# Patient Record
Sex: Male | Born: 1990 | Race: Black or African American | Hispanic: No | Marital: Single | Smoking: Heavy tobacco smoker
Health system: Southern US, Community
[De-identification: ages and names within clinical notes are randomized; demographics above are authoritative.]

## PROBLEM LIST (undated history)

## (undated) ENCOUNTER — Emergency Department (HOSPITAL_COMMUNITY): Admission: EM | Payer: Self-pay | Source: Home / Self Care

## (undated) DIAGNOSIS — A549 Gonococcal infection, unspecified: Secondary | ICD-10-CM

## (undated) DIAGNOSIS — E119 Type 2 diabetes mellitus without complications: Secondary | ICD-10-CM

## (undated) DIAGNOSIS — I499 Cardiac arrhythmia, unspecified: Secondary | ICD-10-CM

---

## 2013-06-23 ENCOUNTER — Observation Stay (HOSPITAL_COMMUNITY)
Admission: EM | Admit: 2013-06-23 | Discharge: 2013-06-24 | Disposition: A | Discharge status: Home / Self Care | Payer: Self-pay | Attending: Emergency Medicine | Admitting: Emergency Medicine

## 2013-06-23 ENCOUNTER — Emergency Department (HOSPITAL_COMMUNITY): Payer: Self-pay

## 2013-06-23 ENCOUNTER — Encounter (HOSPITAL_COMMUNITY): Payer: Self-pay | Admitting: Emergency Medicine

## 2013-06-23 DIAGNOSIS — J159 Unspecified bacterial pneumonia: Secondary | ICD-10-CM | POA: Insufficient documentation

## 2013-06-23 DIAGNOSIS — J189 Pneumonia, unspecified organism: Secondary | ICD-10-CM

## 2013-06-23 DIAGNOSIS — F17200 Nicotine dependence, unspecified, uncomplicated: Secondary | ICD-10-CM | POA: Insufficient documentation

## 2013-06-23 DIAGNOSIS — J020 Streptococcal pharyngitis: Secondary | ICD-10-CM

## 2013-06-23 DIAGNOSIS — I4891 Unspecified atrial fibrillation: Principal | ICD-10-CM

## 2013-06-23 DIAGNOSIS — R002 Palpitations: Secondary | ICD-10-CM | POA: Insufficient documentation

## 2013-06-23 DIAGNOSIS — R110 Nausea: Secondary | ICD-10-CM | POA: Insufficient documentation

## 2013-06-23 DIAGNOSIS — Z720 Tobacco use: Secondary | ICD-10-CM

## 2013-06-23 DIAGNOSIS — I059 Rheumatic mitral valve disease, unspecified: Secondary | ICD-10-CM

## 2013-06-23 HISTORY — DX: Cardiac arrhythmia, unspecified: I49.9

## 2013-06-23 LAB — CBC
HEMATOCRIT: 51.7 % (ref 39.0–52.0)
Hemoglobin: 18.3 g/dL — ABNORMAL HIGH (ref 13.0–17.0)
MCH: 32.9 pg (ref 26.0–34.0)
MCHC: 35.4 g/dL (ref 30.0–36.0)
MCV: 92.8 fL (ref 78.0–100.0)
Platelets: 197 10*3/uL (ref 150–400)
RBC: 5.57 MIL/uL (ref 4.22–5.81)
RDW: 12.7 % (ref 11.5–15.5)
WBC: 7.1 10*3/uL (ref 4.0–10.5)

## 2013-06-23 LAB — BASIC METABOLIC PANEL
BUN: 13 mg/dL (ref 6–23)
CO2: 26 mEq/L (ref 19–32)
Calcium: 9.7 mg/dL (ref 8.4–10.5)
Chloride: 102 mEq/L (ref 96–112)
Creatinine, Ser: 1.11 mg/dL (ref 0.50–1.35)
GFR calc Af Amer: >90 mL/min (ref >90)
Glucose, Bld: 108 mg/dL — ABNORMAL HIGH (ref 70–99)
POTASSIUM: 4.5 meq/L (ref 3.7–5.3)
SODIUM: 141 meq/L (ref 137–147)

## 2013-06-23 LAB — RAPID URINE DRUG SCREEN, HOSP PERFORMED
AMPHETAMINES: NOT DETECTED
Barbiturates: NOT DETECTED
Benzodiazepines: NOT DETECTED
Cocaine: NOT DETECTED
OPIATES: NOT DETECTED
TETRAHYDROCANNABINOL: NOT DETECTED

## 2013-06-23 LAB — TROPONIN I: Troponin I: <0.3 ng/mL (ref <0.30)

## 2013-06-23 LAB — I-STAT TROPONIN, ED: Troponin i, poc: 0.01 ng/mL (ref 0.00–0.08)

## 2013-06-23 MED ORDER — ENOXAPARIN SODIUM 40 MG/0.4ML ~~LOC~~ SOLN
40.0000 mg | SUBCUTANEOUS | Status: DC
  Administered 2013-06-23: 40 mg via SUBCUTANEOUS
  Filled 2013-06-23 (×2): qty 0.4

## 2013-06-23 MED ORDER — ZOLPIDEM TARTRATE 5 MG PO TABS: 5.0000 mg | ORAL_TABLET | Freq: Every evening | ORAL | Status: DC | PRN

## 2013-06-23 MED ORDER — SODIUM CHLORIDE 0.9 % IV BOLUS (SEPSIS)
1000.0000 mL | Freq: Once | INTRAVENOUS | Status: AC
  Administered 2013-06-23: 1000 mL via INTRAVENOUS

## 2013-06-23 MED ORDER — DEXTROSE 5 % IV SOLN
1.0000 g | Freq: Once | INTRAVENOUS | Status: AC
  Administered 2013-06-23: 1 g via INTRAVENOUS
  Filled 2013-06-23: qty 10

## 2013-06-23 MED ORDER — ALPRAZOLAM 0.25 MG PO TABS: 0.2500 mg | ORAL_TABLET | Freq: Two times a day (BID) | ORAL | Status: DC | PRN

## 2013-06-23 MED ORDER — ACETAMINOPHEN 325 MG PO TABS: 650.0000 mg | ORAL_TABLET | ORAL | Status: DC | PRN

## 2013-06-23 MED ORDER — ONDANSETRON HCL 4 MG/2ML IJ SOLN: 4.0000 mg | Freq: Four times a day (QID) | INTRAMUSCULAR | Status: DC | PRN

## 2013-06-23 MED ORDER — AZITHROMYCIN 1 G PO PACK
1.0000 g | PACK | Freq: Once | ORAL | Status: AC
  Administered 2013-06-23: 1 g via ORAL
  Filled 2013-06-23: qty 1

## 2013-06-23 MED ORDER — FLECAINIDE ACETATE 100 MG PO TABS
300.0000 mg | ORAL_TABLET | Freq: Once | ORAL | Status: AC
  Administered 2013-06-23: 300 mg via ORAL
  Filled 2013-06-23: qty 3

## 2013-06-23 MED ORDER — OFF THE BEAT BOOK
Freq: Once | Status: AC
  Administered 2013-06-23: 20:00:00
  Filled 2013-06-23: qty 1

## 2013-06-23 NOTE — ED Notes (Signed)
Pt returned from echo; back on monitor; cardiologist at bedside. 2L hung.

## 2013-06-23 NOTE — ED Provider Notes (Signed)
CSN: [id number]     Arrival date & time 06/23/13  1010 History   First MD Initiated Contact with Patient 06/23/13 1107     Chief Complaint  Patient presents with  . Chest Pain  . Palpitations      Patient is a 23 y.o. male presenting with palpitations. The history is provided by the patient.  Palpitations Palpitations quality:  Irregular Onset quality:  Sudden Duration:  12 hours Timing:  Intermittent Progression:  Improving Chronicity:  New Relieved by:  None tried Worsened by:  Nothing tried Associated symptoms: cough and nausea   Associated symptoms: no chest pain, no lower extremity edema, no shortness of breath, no syncope and no vomiting   pt reports onset of palpitations last night.  He has never had this before He denies any CP (nurse note reports he had CP) He reports he is feeling improved   No h/o CAD No h/o atrial fibrillation   PMH - none Soc hx - smokes cigarettes, social ETOH use.  No drug/cocaine/THC use.  Reports daily caffeine intake   History  Substance Use Topics  . Smoking status: Current Every Day Smoker  . Smokeless tobacco: Not on file  . Alcohol Use: Yes    Review of Systems  Constitutional: Negative for fever.  Respiratory: Positive for cough. Negative for shortness of breath.   Cardiovascular: Positive for palpitations. Negative for chest pain and syncope.  Gastrointestinal: Positive for nausea. Negative for vomiting.  Neurological: Negative for syncope.  All other systems reviewed and are negative.     Allergies  Review of patient's allergies indicates no known allergies.  Home Medications   Prior to Admission medications   Not on File   BP 102/63  Pulse 84  Temp(Src) 98.2 F (36.8 C) (Oral)  Resp 18  Ht 5\' 9"  (1.753 m)  Wt 186 lb (84.369 kg)  BMI 27.45 kg/m2  SpO2 97% Physical Exam CONSTITUTIONAL: Well developed/well nourished HEAD: Normocephalic/atraumatic EYES: EOMI/PERRL ENMT: Mucous membranes moist NECK:  supple no meningeal signs SPINE:entire spine nontender CV: irregular, rate controlled no murmurs/rubs/gallops noted LUNGS: Lungs are clear to auscultation bilaterally, no apparent distress ABDOMEN: soft, nontender, no rebound or guarding GU:no cva tenderness NEURO: Pt is awake/alert, moves all extremitiesx4 EXTREMITIES: pulses normal, full ROM SKIN: warm, color normal PSYCH: no abnormalities of mood noted  ED Course  Procedures  11:41 AM Pt currently denies CP He does have new onset atrial fibrillation He is well appearing ?pneumonia by CXR, will treat with antibiotics 11:53 AM D/w dr bensimhon He has reviewed EKG He requests urine drug screen He also requests stat bedside echo 1:34 PM D/w cardiology will admit Echo is "normal" per cardiology  Labs Review Labs Reviewed  CBC - Abnormal; Notable for the following:    Hemoglobin 18.3 (*)    All other components within normal limits  BASIC METABOLIC PANEL - Abnormal; Notable for the following:    Glucose, Bld 108 (*)    All other components within normal limits  I-STAT TROPOININ, ED    Imaging Review Dg Chest 2 View (if Patient Has Fever And/or Copd)  06/23/2013   CLINICAL DATA:  Cough and congestion  EXAM: CHEST  2 VIEW  COMPARISON:  None.  FINDINGS: The heart size and mediastinal contours are within normal limits. Minimal patchy left upper lobe airspace opacity is identified. The visualized skeletal structures are unremarkable.  IMPRESSION: Patchy left upper lobe airspace opacity. Rounded pneumonia could have this appearance and may be seen in  a relatively young age group. Followup to radiographic resolution is recommended in 4-6 weeks to document resolution.   Electronically Signed   By: Christiana PellantGretchen  Green M.D.   On: 06/23/2013 11:00     EKG Interpretation   Date/Time:  Friday June 23 2013 11:27:24 EDT Ventricular Rate:  85 PR Interval:    QRS Duration: 76 QT Interval:  362 QTC Calculation: 430 R Axis:   72 Text  Interpretation:  Atrial fibrillation Ventricular premature complex  Abnormal T, consider ischemia, diffuse leads ST elevation, consider  anterior injury Confirmed by Bebe ShaggyWICKLINE  MD, Maceo Hernan ([id number]) on 06/23/2013  11:38:26 AM      MDM   Final diagnoses:  Atrial fibrillation  CAP (community acquired pneumonia)    Nursing notes including past medical history and social history reviewed and considered in documentation xrays reviewed and considered Labs/vital reviewed and considered     Joya Gaskinsonald W Worth Kober, MD 06/23/13 1334

## 2013-06-23 NOTE — ED Notes (Signed)
Pt c/o center chest pain and palpitations onset today while at work. Pt reports that he was feeling lightheaded also. Last night pt experienced nausea while lying down.

## 2013-06-23 NOTE — ED Notes (Signed)
Pt to echo.

## 2013-06-23 NOTE — ED Notes (Signed)
repeat EKG given to EDP

## 2013-06-23 NOTE — ED Notes (Signed)
Pt given urinal and asked to provide specimen

## 2013-06-23 NOTE — ED Notes (Signed)
Pt states he has recently been very dehydrated due to episode of strep throat. Throat is feeling better.

## 2013-06-23 NOTE — ED Notes (Signed)
1st attempt at report 

## 2013-06-23 NOTE — H&P (Signed)
Lelon Perla, MD Physician Addendum Cardiology Consult Note Service date: 06/23/2013 1:07 PM         HPI: 23 year old male with no prior history for evaluation of atrial fibrillation. Patient typically does not have significant dyspnea on exertion, orthopnea, PND, pedal edema, palpitations, syncope or chest pain. At 11:00 last night he developed palpitations. These were described as a skip. When he awoke this morning his symptoms had resolved. However he went to work and noted recurrence of his symptoms and there was associated chest heaviness, dyspnea and weakness. No syncope. He presented to the emergency room and was found to be in atrial fibrillation. Cardiology asked to evaluate. Patient does state that he had strep throat 48 hours ago. He took an antibiotic but does not recall the name. Symptoms have resolved.   (Not in a hospital admission)   No Known Allergies  History reviewed. No pertinent past medical history.   History reviewed. No pertinent past surgical history.    History       Social History   .  Marital Status:  Single       Spouse Name:  N/A       Number of Children:  2   .  Years of Education:  N/A       Occupational History   .  WASTE MANAGEMENT  [employer]       Social History Main Topics   .  Smoking status:  Current Every Day Smoker   .  Smokeless tobacco:  Not on file   .  Alcohol Use:  Yes   .  Drug Use:  No   .  Sexual Activity:  Not on file       Other Topics  Concern   .  Not on file       Social History Narrative   .  No narrative on file      No family history on file.   ROS:  Recent strep throat but no fevers or chills, productive cough, hemoptysis, dysphasia, odynophagia, melena, hematochezia, dysuria, hematuria, rash, seizure activity, orthopnea, PND, pedal edema, claudication. Remaining systems are negative.   Physical Exam:    Blood pressure 101/71, pulse 83, temperature 98.2 F (36.8 C), temperature source Oral,  resp. rate 17, height 5' 9"  (1.753 m), weight 186 lb (84.369 kg), SpO2 99.00%.   General:  Well developed/well nourished in NAD Skin warm/dry Patient not depressed No peripheral clubbing Back-normal HEENT-normal/normal eyelids Neck supple/normal carotid upstroke bilaterally; no bruits; no JVD; no thyromegaly chest - CTA/ normal expansion CV - irregular/normal S1 and S2; no murmurs, rubs or gallops;  PMI nondisplaced Abdomen -NT/ND, no HSM, no mass, + bowel sounds, no bruit 2+ femoral pulses, no bruits Ext-no edema, chords, 2+ DP Neuro-grossly nonfocal   ECG Atrial fibrillation with Early repolarization abnormality and inferior lateral T-wave inversion    Results for orders placed during the hospital encounter of 06/23/13 (from the past 48 hour(s))   CBC     Status: Abnormal     Collection Time      06/23/13 10:45 AM       Result  Value  Ref Range     WBC  7.1   4.0 - 10.5 K/uL     RBC  5.57   4.22 - 5.81 MIL/uL     Hemoglobin  18.3 (*)  13.0 - 17.0 g/dL     HCT  51.7   39.0 - 52.0 %  MCV  92.8   78.0 - 100.0 fL     MCH  32.9   26.0 - 34.0 pg     MCHC  35.4   30.0 - 36.0 g/dL     RDW  12.7   11.5 - 15.5 %     Platelets  197   150 - 400 K/uL   BASIC METABOLIC PANEL     Status: Abnormal     Collection Time      06/23/13 10:45 AM       Result  Value  Ref Range     Sodium  141   137 - 147 mEq/L     Potassium  4.5   3.7 - 5.3 mEq/L     Chloride  102   96 - 112 mEq/L     CO2  26   19 - 32 mEq/L     Glucose, Bld  108 (*)  70 - 99 mg/dL     BUN  13   6 - 23 mg/dL     Creatinine, Ser  1.11   0.50 - 1.35 mg/dL     Calcium  9.7   8.4 - 10.5 mg/dL     GFR calc non Af Amer  >90   >90 mL/min     GFR calc Af Amer  >90   >90 mL/min     Comment:  (NOTE)        The eGFR has been calculated using the CKD EPI equation.        This calculation has not been validated in all clinical situations.        eGFR's persistently <90 mL/min signify possible Chronic Kidney        Disease.    Randolm Idol, ED     Status: None     Collection Time      06/23/13 11:26 AM       Result  Value  Ref Range     Troponin i, poc  0.01   0.00 - 0.08 ng/mL     Comment 3                Comment:  Due to the release kinetics of cTnI,        a negative result within the first hours        of the onset of symptoms does not rule out        myocardial infarction with certainty.        If myocardial infarction is still suspected,        repeat the test at appropriate intervals.      Dg Chest 2 View (if Patient Has Fever And/or Copd)   06/23/2013   CLINICAL DATA:  Cough and congestion  EXAM: CHEST  2 VIEW  COMPARISON:  None.  FINDINGS: The heart size and mediastinal contours are within normal limits. Minimal patchy left upper lobe airspace opacity is identified. The visualized skeletal structures are unremarkable.  IMPRESSION: Patchy left upper lobe airspace opacity. Rounded pneumonia could have this appearance and may be seen in a relatively young age group. Followup to radiographic resolution is recommended in 4-6 weeks to document resolution.   Electronically Signed   By: Conchita Paris M.D.   On: 06/23/2013 11:00     Assessment/Plan 1 atrial fibrillation-symptoms began at 11:00 last night. No embolic risk factors. Add enteric-coated aspirin 81 mg daily. Rate is controlled on no medications. Check echocardiogram and TSH. I will give flecainide 300  mg by mouth x1 if LV function normal to see if he converts. 2 chest heaviness-electrocardiogram most likely consistent with early repolarization abnormality. Cycle enzymes and if negative no further ischemia evaluation. 3 tobacco abuse-patient counseled on discontinuing. 4 abnormal chest x-ray-left upper lobe airspace disease on chest x-ray. Repeat PA and lateral chest x-ray tomorrow morning.   Kirk Ruths MD 06/23/2013, 1:07 PM

## 2013-06-23 NOTE — Consult Note (Addendum)
HPI: 23 year old male with no prior history for evaluation of atrial fibrillation. Patient typically does not have significant dyspnea on exertion, orthopnea, PND, pedal edema, palpitations, syncope or chest pain. At 11:00 last night he developed palpitations. These were described as a skip. When he awoke this morning his symptoms had resolved. However he went to work and noted recurrence of his symptoms and there was associated chest heaviness, dyspnea and weakness. No syncope. He presented to the emergency room and was found to be in atrial fibrillation. Cardiology asked to evaluate. Patient does state that he had strep throat 48 hours ago. He took an antibiotic but does not recall the name. Symptoms have resolved.   (Not in a hospital admission)  No Known Allergies  History reviewed. No pertinent past medical history.  History reviewed. No pertinent past surgical history.  History   Social History  . Marital Status: Single    Spouse Name: N/A    Number of Children: 2  . Years of Education: N/A   Occupational History  . WASTE MANAGEMENT [employer]   Social History Main Topics  . Smoking status: Current Every Day Smoker  . Smokeless tobacco: Not on file  . Alcohol Use: Yes  . Drug Use: No  . Sexual Activity: Not on file   Other Topics Concern  . Not on file   Social History Narrative  . No narrative on file    No family history on file.  ROS:  Recent strep throat but no fevers or chills, productive cough, hemoptysis, dysphasia, odynophagia, melena, hematochezia, dysuria, hematuria, rash, seizure activity, orthopnea, PND, pedal edema, claudication. Remaining systems are negative.  Physical Exam:   Blood pressure 101/71, pulse 83, temperature 98.2 F (36.8 C), temperature source Oral, resp. rate 17, height 5' 9"  (1.753 m), weight 186 lb (84.369 kg), SpO2 99.00%.  General:  Well developed/well nourished in NAD Skin warm/dry Patient not depressed No peripheral  clubbing Back-normal HEENT-normal/normal eyelids Neck supple/normal carotid upstroke bilaterally; no bruits; no JVD; no thyromegaly chest - CTA/ normal expansion CV - irregular/normal S1 and S2; no murmurs, rubs or gallops;  PMI nondisplaced Abdomen -NT/ND, no HSM, no mass, + bowel sounds, no bruit 2+ femoral pulses, no bruits Ext-no edema, chords, 2+ DP Neuro-grossly nonfocal  ECG Atrial fibrillation with Early repolarization abnormality and inferior lateral T-wave inversion  Results for orders placed during the hospital encounter of 06/23/13 (from the past 48 hour(s))  CBC     Status: Abnormal   Collection Time    06/23/13 10:45 AM      Result Value Ref Range   WBC 7.1  4.0 - 10.5 K/uL   RBC 5.57  4.22 - 5.81 MIL/uL   Hemoglobin 18.3 (*) 13.0 - 17.0 g/dL   HCT 51.7  39.0 - 52.0 %   MCV 92.8  78.0 - 100.0 fL   MCH 32.9  26.0 - 34.0 pg   MCHC 35.4  30.0 - 36.0 g/dL   RDW 12.7  11.5 - 15.5 %   Platelets 197  150 - 400 K/uL  BASIC METABOLIC PANEL     Status: Abnormal   Collection Time    06/23/13 10:45 AM      Result Value Ref Range   Sodium 141  137 - 147 mEq/L   Potassium 4.5  3.7 - 5.3 mEq/L   Chloride 102  96 - 112 mEq/L   CO2 26  19 - 32 mEq/L   Glucose, Bld 108 (*) 70 -  99 mg/dL   BUN 13  6 - 23 mg/dL   Creatinine, Ser 1.11  0.50 - 1.35 mg/dL   Calcium 9.7  8.4 - 10.5 mg/dL   GFR calc non Af Amer >90  >90 mL/min   GFR calc Af Amer >90  >90 mL/min   Comment: (NOTE)     The eGFR has been calculated using the CKD EPI equation.     This calculation has not been validated in all clinical situations.     eGFR's persistently <90 mL/min signify possible Chronic Kidney     Disease.  Randolm Idol, ED     Status: None   Collection Time    06/23/13 11:26 AM      Result Value Ref Range   Troponin i, poc 0.01  0.00 - 0.08 ng/mL   Comment 3            Comment: Due to the release kinetics of cTnI,     a negative result within the first hours     of the onset of symptoms  does not rule out     myocardial infarction with certainty.     If myocardial infarction is still suspected,     repeat the test at appropriate intervals.    Dg Chest 2 View (if Patient Has Fever And/or Copd)  06/23/2013   CLINICAL DATA:  Cough and congestion  EXAM: CHEST  2 VIEW  COMPARISON:  None.  FINDINGS: The heart size and mediastinal contours are within normal limits. Minimal patchy left upper lobe airspace opacity is identified. The visualized skeletal structures are unremarkable.  IMPRESSION: Patchy left upper lobe airspace opacity. Rounded pneumonia could have this appearance and may be seen in a relatively young age group. Followup to radiographic resolution is recommended in 4-6 weeks to document resolution.   Electronically Signed   By: Conchita Paris M.D.   On: 06/23/2013 11:00    Assessment/Plan 1 atrial fibrillation-symptoms began at 11:00 last night. No embolic risk factors. Add enteric-coated aspirin 81 mg daily. Rate is controlled on no medications. Check echocardiogram and TSH. I will give flecainide 300 mg by mouth x1 if LV function normal to see if he converts. 2 chest heaviness-electrocardiogram most likely consistent with early repolarization abnormality. Cycle enzymes and if negative no further ischemia evaluation. 3 tobacco abuse-patient counseled on discontinuing. 4 abnormal chest x-ray-left upper lobe airspace disease on chest x-ray. Repeat PA and lateral chest x-ray tomorrow morning.  Kirk Ruths MD 06/23/2013, 1:07 PM

## 2013-06-23 NOTE — Progress Notes (Signed)
Echo Lab  2D Echocardiogram completed.  Jeromie Gainor L Shylynn Bruning, RDCS 06/23/2013 12:45 PM

## 2013-06-23 NOTE — ED Notes (Signed)
MD at bedside. 

## 2013-06-23 NOTE — Progress Notes (Signed)
Patient in Normal Sinus rythm since admission to 3w31 on telemetry.  EKG obtained and confirmed.  Will continue to monitor.  Colman CaterFarrah Danielle Doniqua Saxby

## 2013-06-23 NOTE — ED Notes (Signed)
Pharm called for tambocor.

## 2013-06-24 DIAGNOSIS — J020 Streptococcal pharyngitis: Secondary | ICD-10-CM

## 2013-06-24 DIAGNOSIS — F17200 Nicotine dependence, unspecified, uncomplicated: Secondary | ICD-10-CM

## 2013-06-24 LAB — TROPONIN I: Troponin I: <0.3 ng/mL (ref <0.30)

## 2013-06-24 MED ORDER — DILTIAZEM HCL 30 MG PO TABS: 30.0000 mg | ORAL_TABLET | ORAL | Status: DC | PRN

## 2013-06-24 NOTE — Discharge Summary (Signed)
  Physician Discharge Summary  Patient ID: Ricardo Boyer MRN: [id number] DOB/AGE: 25/07/1990 25 y.o.  Admit date: 06/23/2013 Discharge date: 06/24/2013  Admission Diagnoses: Atrial Fibrillation   Discharge Diagnoses:  Active Problems:   Atrial fibrillation   Angina pectoris   Discharged Condition: stable  Hospital Course: Mr. Ricardo Boyer is a 23 y/o male with no prior cardiac history, but with a recent diagnosis of strep throat, placed on antibiotics by his PCP, who [facility] with a complaint of palpitations with associated chest heaviness, dyspnea and weakness. An EKG demonstrated atrial fibrillation with a CVC. W/u including a TSH, which was normal. Cardiac enzymes were negative x 3. A 2D echo revealed normal systolic function with an EF of 60-65%, mild MR and no pericardial effusion. A CXR demonstrated possible left upper lobe airspace disease. A repeat CXR in several weeks was recommended by radiology. Since he had normal systolic function by echo, it was decided to give a dose of Flecainide. He successfully converted back to NSR after one dose. He had no further recurrence. His CHADS-Vasc score was 0. Thus, no anticoagulation was indicated. He was last seen and examined by Dr. Purvis SheffieldKoneswaran, who determined he was stable for discharge home. He recommend that he f/u with his PCP and have repeat chest Xray in 4-6 weeks as recommended by radiology. He was prescribed Cardizem, 30 mg, to be used PRN for tachycardia-palpitations. [facility] either with Dr. Jens Somrenshaw or an APP in 2-3 weeks.    Consults: None  Significant Diagnostic Studies:    2D Echo 06/23/13 Study Conclusions  - Left ventricle: The cavity size was normal. Wall thickness was increased in a pattern of mild LVH. Systolic function was normal. The estimated ejection fraction was in the range of 60% to 65%. - Mitral valve: Mild regurgitation. - Right atrium: The atrium was mildly  dilated.   CXR 06/23/13 FINDINGS: The heart size and mediastinal contours are within normal limits. Minimal patchy left upper lobe airspace opacity is identified. The visualized skeletal structures are unremarkable.  IMPRESSION: Patchy left upper lobe airspace opacity. Rounded pneumonia could have this appearance and may be seen in a relatively young age group. Followup to radiographic resolution is recommended in 4-6 weeks to document resolution.   Treatments:See Hospital Course  Discharge Exam: Blood pressure 127/78, pulse 80, temperature 98.2 F (36.8 C), temperature source Oral, resp. rate 18, height 5\' 9"  (1.753 m), weight 186 lb (84.369 kg), SpO2 100.00%.   Disposition: 01-Home or Self Care      Discharge Orders   Future Orders Complete By Expires   Diet - low sodium heart healthy  As directed    Increase activity slowly  As directed        Medication List         diltiazem 30 MG tablet  Commonly known as:  CARDIZEM  Take 1 tablet (30 mg total) by mouth as needed (for palpitations/ fast heart rates).       Follow-up Information   Follow up with Olga MillersBrian Crenshaw, MD. (our office will call you on Monday to arrange a follow-up appointment)    Specialty:  Cardiology   Contact information:   [address] [place] [address] [phone]     TIME SPENT ON DISCHARGE, INCLUDING PHYSICIAN TIME: >30 MINUTES  Signed: Robbie LisBrittainy Sarie Stall 06/24/2013, 9:40 AM

## 2013-06-24 NOTE — Discharge Instructions (Signed)
Atrial Fibrillation Atrial fibrillation is a type of irregular heart rhythm (arrhythmia). During atrial fibrillation, the upper chambers of the heart (atria) quiver continuously in a chaotic pattern. This causes an irregular and often rapid heart rate.  Atrial fibrillation is the result of the heart becoming overloaded with disorganized signals that tell it to beat. These signals are normally released one at a time by a part of the right atrium called the sinoatrial node. They then travel from the atria to the lower chambers of the heart (ventricles), causing the atria and ventricles to contract and pump blood as they pass. In atrial fibrillation, parts of the atria outside of the sinoatrial node also release these signals. This results in two problems. First, the atria receive so many signals that they do not have time to fully contract. Second, the ventricles, which can only receive one signal at a time, beat irregularly and out of rhythm with the atria.  There are three types of atrial fibrillation:   Paroxysmal Paroxysmal atrial fibrillation starts suddenly and stops on its own within a week.   Persistent Persistent atrial fibrillation lasts for more than a week. It may stop on its own or with treatment.   Permanent Permanent atrial fibrillation does not go away. Episodes of atrial fibrillation may lead to permanent atrial fibrillation.  Atrial fibrillation can prevent your heart from pumping blood normally. It increases your risk of stroke and can lead to heart failure.  CAUSES   Heart conditions, including a heart attack, heart failure, coronary artery disease, and heart valve conditions.   Inflammation of the sac that surrounds the heart (pericarditis).   Blockage of an artery in the lungs (pulmonary embolism).   Pneumonia or other infections.   Chronic lung disease.   Thyroid problems, especially if the thyroid is overactive (hyperthyroidism).   Caffeine, excessive alcohol  use, and use of some illegal drugs.   Use of some medications, including certain decongestants and diet pills.   Heart surgery.   Birth defects.  Sometimes, no cause can be found. When this happens, the atrial fibrillation is called lone atrial fibrillation. The risk of complications from atrial fibrillation increases if you have lone atrial fibrillation and you are age 25 years or older. RISK FACTORS  Heart failure.  Coronary artery disease  Diabetes mellitus.   High blood pressure (hypertension).   Obesity.   Other arrhythmias.   Increased age. SYMPTOMS   A feeling that your heart is beating rapidly or irregularly.   A feeling of discomfort or pain in your chest.   Shortness of breath.   Sudden lightheadedness or weakness.   Getting tired easily when exercising.   Urinating more often than normal (mainly when atrial fibrillation first begins).  In paroxysmal atrial fibrillation, symptoms may start and suddenly stop. DIAGNOSIS  Your caregiver may be able to detect atrial fibrillation when taking your pulse. Usually, testing is needed to diagnosis atrial fibrillation. Tests may include:   Electrocardiography. During this test, the electrical impulses of your heart are recorded while you are lying down.   Echocardiography. During echocardiography, sound waves are used to evaluate how blood flows through your heart.   Stress test. There is more than one type of stress test. If a stress test is needed, ask your caregiver about which type is best for you.   Chest X-ray exam.   Blood tests.   Computed tomography (CT).  TREATMENT   Treating any underlying conditions. For example, if you have an overactive  thyroid, treating the condition may correct atrial fibrillation.   Medication. Medications may be given to control a rapid heart rate or to prevent blood clots, heart failure, or a stroke.   Procedure to correct the rhythm of the  heart:  Electrical cardioversion. During electrical cardioversion, a controlled, low-energy shock is delivered to the heart through your skin. If you have chest pain, very low pressure blood pressure, or sudden heart failure, this procedure may need to be done as an emergency.  Catheter ablation. During this procedure, heart tissues that send the signals that cause atrial fibrillation are destroyed.  Maze or minimaze procedure. During this surgery, thin lines of heart tissue that carry the abnormal signals are destroyed. The maze procedure is an open-heart surgery. The minimaze procedure is a minimally invasive surgery. This means that small cuts are made to access the heart instead of a large opening.  Pulmonary venous isolation. During this surgery, tissue around the veins that carry blood from the lungs (pulmonary veins) is destroyed. This tissue is thought to carry the abnormal signals. HOME CARE INSTRUCTIONS   Take medications as directed by your caregiver.  Only take medications that your caregiver approves. Some medications can make atrial fibrillation worse or recur.  If blood thinners were prescribed by your caregiver, take them exactly as directed. Too much can cause bleeding. Too little and you will not have the needed protection against stroke and other problems.  Perform blood tests at home if directed by your caregiver.  Perform blood tests exactly as directed.   Quit smoking if you smoke.   Do not drink alcohol.   Do not drink caffeinated beverages such as coffee, soda, and some teas. You may drink decaffeinated coffee, soda, or tea.   Maintain a healthy weight. Do not use diet pills unless your caregiver approves. They may make heart problems worse.   Follow diet instructions as directed by your caregiver.   Exercise regularly as directed by your caregiver.   Keep all follow-up appointments. PREVENTION  The following substances can cause atrial fibrillation  to recur:   Caffeinated beverages.   Alcohol.   Certain medications, especially those used for breathing problems.   Certain herbs and herbal medications, such as those containing ephedra or ginseng.  Illegal drugs such as cocaine and amphetamines. Sometimes medications are given to prevent atrial fibrillation from recurring. Proper treatment of any underlying condition is also important in helping prevent recurrence.  SEEK MEDICAL CARE IF:  You notice a change in the rate, rhythm, or strength of your heartbeat.   You suddenly begin urinating more frequently.   You tire more easily when exerting yourself or exercising.  SEEK IMMEDIATE MEDICAL CARE IF:   You develop chest pain, abdominal pain, sweating, or weakness.  You feel sick to your stomach (nauseous).  You develop shortness of breath.  You suddenly develop swollen feet and ankles.  You feel dizzy.  You face or limbs feel numb or weak.  There is a change in your vision or speech. MAKE SURE YOU:   Understand these instructions.  Will watch your condition.  Will get help right away if you are not doing well or get worse. Document Released: 02/23/2005 Document Revised: 06/20/2012 Document Reviewed: 04/05/2012 [facility] Patient Information 2014 [place], [place].   Cardiac Diet This diet can help prevent heart disease and stroke. Many factors influence your heart health, including eating and exercise habits. Coronary risk rises a lot with abnormal blood fat (lipid) levels. Cardiac meal planning  includes limiting unhealthy fats, increasing healthy fats, and making other small dietary changes. General guidelines are as follows:  Adjust calorie intake to reach and maintain desirable body weight.  Limit total fat intake to less than 30% of total calories. Saturated fat should be less than 7% of calories.  Saturated fats are found in animal products and in some vegetable products. Saturated vegetable fats are found  in coconut oil, cocoa butter, palm oil, and palm kernel oil. Read labels carefully to avoid these products as much as possible. Use butter in moderation. Choose tub margarines and oils that have 2 grams of fat or less. Good cooking oils are canola and olive oils.  Practice low-fat cooking techniques. Do not fry food. Instead, broil, bake, boil, steam, grill, roast on a rack, stir-fry, or microwave it. Other fat reducing suggestions include:  Remove the skin from poultry.  Remove all visible fat from meats.  Skim the fat off stews, soups, and gravies before serving them.  Steam vegetables in water or broth instead of sauting them in fat.  Avoid foods with trans fat (or hydrogenated oils), such as commercially fried foods and commercially baked goods. Commercial shortening and deep-frying fats will contain trans fat.  Increase intake of fruits, vegetables, whole grains, and legumes to replace foods high in fat.  Increase consumption of nuts, legumes, and seeds to at least 4 servings weekly. One serving of a legume equals  cup, and 1 serving of nuts or seeds equals  cup.  Choose whole grains more often. Have 3 servings per day (a serving is 1 ounce [oz]).  Eat 4 to 5 servings of vegetables per day. A serving of vegetables is 1 cup of raw leafy vegetables;  cup of raw or cooked cut-up vegetables;  cup of vegetable juice.  Eat 4 to 5 servings of fruit per day. A serving of fruit is 1 medium whole fruit;  cup of dried fruit;  cup of fresh, frozen, or canned fruit;  cup of 100% fruit juice.  Increase your intake of dietary fiber to 20 to 30 grams per day. Insoluble fiber may help lower your risk of heart disease and may help curb your appetite.  Soluble fiber binds cholesterol to be removed from the blood. Foods high in soluble fiber are dried beans, citrus fruits, oats, apples, bananas, broccoli, Brussels sprouts, and eggplant.  Try to include foods fortified with plant sterols or  stanols, such as yogurt, breads, juices, or margarines. Choose several fortified foods to achieve a daily intake of 2 to 3 grams of plant sterols or stanols.  Foods with omega-3 fats can help reduce your risk of heart disease. Aim to have a 3.5 oz portion of fatty fish twice per week, such as salmon, mackerel, albacore tuna, sardines, lake trout, or herring. If you wish to take a fish oil supplement, choose one that contains 1 gram of both DHA and EPA.  Limit processed meats to 2 servings (3 oz portion) weekly.  Limit the sodium in your diet to 1500 milligrams (mg) per day. If you have high blood pressure, talk to a registered dietitian about a DASH (Dietary Approaches to Stop Hypertension) eating plan.  Limit sweets and beverages with added sugar, such as soda, to no more than 5 servings per week. One serving is:   1 tablespoon sugar.  1 tablespoon jelly or jam.   cup sorbet.  1 cup lemonade.   cup regular soda. CHOOSING FOODS Starches  Allowed: Breads: All kinds (wheat,  rye, raisin, white, oatmeal, [place], [place], and English muffin bread). Low-fat rolls: English muffins, frankfurter and hamburger buns, bagels, pita bread, tortillas (not fried). Pancakes, waffles, biscuits, and muffins made with recommended oil.  Avoid: Products made with saturated or trans fats, oils, or whole milk products. Butter rolls, cheese breads, croissants. Commercial doughnuts, muffins, sweet rolls, biscuits, waffles, pancakes, store-bought mixes. Crackers  Allowed: Low-fat crackers and snacks: Animal, graham, rye, saltine (with recommended oil, no lard), oyster, and matzo crackers. Bread sticks, melba toast, rusks, flatbread, pretzels, and light popcorn.  Avoid: High-fat crackers: cheese crackers, butter crackers, and those made with coconut, palm oil, or trans fat (hydrogenated oils). Buttered popcorn. Cereals  Allowed: Hot or cold whole-grain cereals.  Avoid: Cereals containing coconut,  hydrogenated vegetable fat, or animal fat. Potatoes / Pasta / Rice  Allowed: All kinds of potatoes, rice, and pasta (such as macaroni, spaghetti, and noodles).  Avoid: Pasta or rice prepared with cream sauce or high-fat cheese. Chow mein noodles, [place] fries. Vegetables  Allowed: All vegetables and vegetable juices.  Avoid: Fried vegetables. Vegetables in cream, butter, or high-fat cheese sauces. Limit coconut. Fruit in cream or custard. Protein  Allowed: Limit your intake of meat, seafood, and poultry to no more than 6 oz (cooked weight) per day. All lean, well-trimmed beef, veal, pork, and lamb. All chicken and [place] without skin. All fish and shellfish. Wild game: wild duck, rabbit, pheasant, and venison. Egg whites or low-cholesterol egg substitutes may be used as desired. Meatless dishes: recipes with dried beans, peas, lentils, and tofu (soybean curd). Seeds and nuts: all seeds and most nuts.  Avoid: Prime grade and other heavily marbled and fatty meats, such as short ribs, spare ribs, rib eye roast or steak, frankfurters, sausage, bacon, and high-fat luncheon meats, mutton. Caviar. Commercially fried fish. Domestic duck, goose, venison sausage. Organ meats: liver, gizzard, heart, chitterlings, brains, kidney, sweetbreads. Dairy  Allowed: Low-fat cheeses: nonfat or low-fat cottage cheese (1% or 2% fat), cheeses made with part skim milk, such as mozzarella, farmers, string, or ricotta. (Cheeses should be labeled no more than 2 to 6 grams fat per oz.). Skim (or 1%) milk: liquid, powdered, or evaporated. Buttermilk made with low-fat milk. Drinks made with skim or low-fat milk or cocoa. Chocolate milk or cocoa made with skim or low-fat (1%) milk. Nonfat or low-fat yogurt.  Avoid: Whole milk cheeses, including colby, cheddar, mu[place]ster, [address] Jack, [place]i, [place]rie, [place]embert, [address], Swiss, and blue. Creamed cottage cheese, cream cheese. Whole milk and whole milk products, including  buttermilk or yogurt made from whole milk, drinks made from whole milk. Condensed milk, evaporated whole milk, and 2% milk. Soups and Combination Foods  Allowed: Low-fat low-sodium soups: broth, dehydrated soups, homemade broth, soups with the fat removed, homemade cream soups made with skim or low-fat milk. Low-fat spaghetti, lasagna, chili, and Spanish rice if low-fat ingredients and low-fat cooking techniques are used.  Avoid: Cream soups made with whole milk, cream, or high-fat cheese. All other soups. Desserts and Sweets  Allowed: Sherbet, fruit ices, gelatins, meringues, and angel food cake. Homemade desserts with recommended fats, oils, and milk products. Jam, jelly, honey, marmalade, sugars, and syrups. Pure sugar candy, such as gum drops, hard candy, jelly beans, marshmallows, mints, and small amounts of dark chocolate.  Avoid: Commercially prepared cakes, pies, cookies, frosting, pudding, or mixes for these products. Desserts containing whole milk products, chocolate, coconut, lard, palm oil, or palm kernel oil. Ice cream or ice cream drinks. Candy that contains chocolate, coconut,  butter, hydrogenated fat, or unknown ingredients. Buttered syrups. Fats and Oils  Allowed: Vegetable oils: safflower, sunflower, corn, soybean, cottonseed, sesame, canola, olive, or peanut. Non-hydrogenated margarines. Salad dressing or mayonnaise: homemade or commercial, made with a recommended oil. Low or nonfat salad dressing or mayonnaise.  Limit added fats and oils to 6 to 8 tsp per day (includes fats used in cooking, baking, salads, and spreads on bread). Remember to count the "hidden fats" in foods.  Avoid: Solid fats and shortenings: butter, lard, salt pork, bacon drippings. Gravy containing meat fat, shortening, or suet. Cocoa butter, coconut. Coconut oil, palm oil, palm kernel oil, or hydrogenated oils: these ingredients are often used in bakery products, nondairy creamers, whipped toppings, candy, and  commercially fried foods. Read labels carefully. Salad dressings made of unknown oils, sour cream, or cheese, such as blue cheese and Roquefort. Cream, all kinds: half-and-half, light, heavy, or whipping. Sour cream or cream cheese (even if "light" or low-fat). Nondairy cream substitutes: coffee creamers and sour cream substitutes made with palm, palm kernel, hydrogenated oils, or coconut oil. Beverages  Allowed: Coffee (regular or decaffeinated), tea. Diet carbonated beverages, mineral water. Alcohol: Check with your caregiver. Moderation is recommended.  Avoid: Whole milk, regular sodas, and juice drinks with added sugar. Condiments  Allowed: All seasonings and condiments. Cocoa powder. "Cream" sauces made with recommended ingredients.  Avoid: Carob powder made with hydrogenated fats. SAMPLE MENU Breakfast   cup orange juice   cup oatmeal  1 slice toast  1 tsp margarine  1 cup skim milk Lunch  [place] sandwich with 2 oz [place], 2 slices bread  Lettuce and tomato slices  Fresh fruit  Carrot sticks  Coffee or tea Snack  Fresh fruit or low-fat crackers Dinner  3 oz lean ground beef  1 baked potato  1 tsp margarine   cup asparagus  Lettuce salad  1 tbs non-creamy dressing   cup peach slices  1 cup skim milk Document Released: 12/03/2007 Document Revised: 08/25/2011 Document Reviewed: 05/19/2011 [employer] Patient Information 2014 [place], [place].

## 2013-06-24 NOTE — Progress Notes (Signed)
SUBJECTIVE: Pt says he has been feeling well since 5 pm yesterday, and wants to go home. Denies fevers, chills, shortness of breath, palpitations, and chest pain.    No intake or output data in the 24 hours ending 06/24/13 0809  Current Facility-Administered Medications  Medication Dose Route Frequency Provider Last Rate Last Dose  . acetaminophen (TYLENOL) tablet 650 mg  650 mg Oral Q4H PRN Rhonda G Barrett, PA-C      . ALPRAZolam (XANAX) tablet 0.25 mg  0.25 mg Oral BID PRN Joline Salthonda G Barrett, PA-C      . enoxaparin (LOVENOX) injection 40 mg  40 mg Subcutaneous Q24H Rhonda G Barrett, PA-C   40 mg at 06/23/13 1813  . ondansetron (ZOFRAN) injection 4 mg  4 mg Intravenous Q6H PRN Rhonda G Barrett, PA-C      . zolpidem (AMBIEN) tablet 5 mg  5 mg Oral QHS PRN,MR X 1 Rhonda G Barrett, PA-C        Filed Vitals:   06/23/13 1430 06/23/13 1454 06/23/13 1943 06/24/13 0655  BP: 102/76 103/61 117/73 127/78  Pulse: 35 85 82 80  Temp:  98 F (36.7 C) 97.7 F (36.5 C) 98.2 F (36.8 C)  TempSrc:  Oral Oral Oral  Resp: 22 20 18 18   Height:      Weight:      SpO2: 97% 100% 99% 100%    PHYSICAL EXAM General: NAD Neck: No JVD, no thyromegaly.  Lungs: Clear to auscultation bilaterally with normal respiratory effort. CV: Nondisplaced PMI.  Regular rate and rhythm, normal S1/S2, no S3/S4, no murmur.  No pretibial edema.  No carotid bruit.  Normal pedal pulses.  Abdomen: Soft, nontender, no hepatosplenomegaly, no distention.  Neurologic: Alert and oriented x 3.  Psych: Normal affect. Extremities: No clubbing or cyanosis.   TELEMETRY: Reviewed telemetry pt in sinus rhythm.  LABS: Basic Metabolic Panel:  Recent Labs  40/98/1104/17/15 1045  NA 141  K 4.5  CL 102  CO2 26  GLUCOSE 108*  BUN 13  CREATININE 1.11  CALCIUM 9.7   Liver Function Tests: No results found for this basename: AST, ALT, ALKPHOS, BILITOT, PROT, ALBUMIN,  in the last 72 hours No results found for this basename:  LIPASE, AMYLASE,  in the last 72 hours CBC:  Recent Labs  06/23/13 1045  WBC 7.1  HGB 18.3*  HCT 51.7  MCV 92.8  PLT 197   Cardiac Enzymes:  Recent Labs  06/23/13 1627 06/23/13 2249 06/24/13 0335  TROPONINI <0.30 <0.30 <0.30   BNP: No components found with this basename: POCBNP,  D-Dimer: No results found for this basename: DDIMER,  in the last 72 hours Hemoglobin A1C: No results found for this basename: HGBA1C,  in the last 72 hours Fasting Lipid Panel: No results found for this basename: CHOL, HDL, LDLCALC, TRIG, CHOLHDL, LDLDIRECT,  in the last 72 hours Thyroid Function Tests: No results found for this basename: TSH, T4TOTAL, FREET3, T3FREE, THYROIDAB,  in the last 72 hours Anemia Panel: No results found for this basename: VITAMINB12, FOLATE, FERRITIN, TIBC, IRON, RETICCTPCT,  in the last 72 hours  RADIOLOGY: Dg Chest 2 View (if Patient Has Fever And/or Copd)  06/23/2013   CLINICAL DATA:  Cough and congestion  EXAM: CHEST  2 VIEW  COMPARISON:  None.  FINDINGS: The heart size and mediastinal contours are within normal limits. Minimal patchy left upper lobe airspace opacity is identified. The visualized skeletal structures are unremarkable.  IMPRESSION: Patchy  left upper lobe airspace opacity. Rounded pneumonia could have this appearance and may be seen in a relatively young age group. Followup to radiographic resolution is recommended in 4-6 weeks to document resolution.   Electronically Signed   By: Christiana PellantGretchen  Green M.D.   On: 06/23/2013 11:00      ASSESSMENT AND PLAN: 1. Paroxysmal atrial fibrillation: Patient converted to NSR yesterday with one dose of flecainide. No recurrences. CHADS-Vasc score of 0. Thus, no anticoagulation indicated. Likely precipitated by stress of strep throat with antibiotic use. Would recommend he f/u with PCP and have repeat chest xray in 4-6 weeks as recommended by radiology. Can prescribe diltiazem 30 mg prn for tachycardia-palpitations.  Should f/u in cardiology office within next few weeks.  Dispo: Discharge to home today.   Prentice DockerSuresh Cabell Lazenby, M.D., F.A.C.C.

## 2013-06-24 NOTE — Progress Notes (Signed)
UR Completed Casimir Barcellos Graves-Bigelow, RN,BSN [phone]  

## 2013-07-06 ENCOUNTER — Encounter: Payer: Self-pay | Admitting: *Deleted

## 2013-07-11 ENCOUNTER — Ambulatory Visit
Admission: RE | Admit: 2013-07-11 | Discharge: 2013-07-11 | Disposition: A | Discharge status: Home / Self Care | Payer: Self-pay | Source: Ambulatory Visit | Attending: Physician Assistant | Admitting: Physician Assistant

## 2013-07-11 ENCOUNTER — Encounter: Payer: Self-pay | Admitting: Physician Assistant

## 2013-07-11 ENCOUNTER — Ambulatory Visit (INDEPENDENT_AMBULATORY_CARE_PROVIDER_SITE_OTHER): Payer: Self-pay | Admitting: Physician Assistant

## 2013-07-11 VITALS — BP 110/70 | HR 62 | Ht 69.0 in | Wt 184.0 lb

## 2013-07-11 DIAGNOSIS — I4891 Unspecified atrial fibrillation: Secondary | ICD-10-CM

## 2013-07-11 DIAGNOSIS — Z720 Tobacco use: Secondary | ICD-10-CM

## 2013-07-11 DIAGNOSIS — R9389 Abnormal findings on diagnostic imaging of other specified body structures: Secondary | ICD-10-CM | POA: Insufficient documentation

## 2013-07-11 DIAGNOSIS — R918 Other nonspecific abnormal finding of lung field: Secondary | ICD-10-CM

## 2013-07-11 DIAGNOSIS — F17200 Nicotine dependence, unspecified, uncomplicated: Secondary | ICD-10-CM

## 2013-07-11 NOTE — Progress Notes (Signed)
  [address] [place], [place]  [address] Phone: [phone] Fax:  [phone]  Date:  07/11/2013   ID:  Ricardo MustacheOscar Javid, DOB 06/04/1990, MRN [id number]  PCP:  No PCP Per Patient  Cardiologist:  Dr. Olga MillersBrian Crenshaw     History of Present Illness: Ricardo Boyer is a 23 y.o. male smoker with no significant past medical history. He was recently admitted 4/17-4/18 with new onset atrial fibrillation. He had recently been treated with antibiotics for strep throat. EF was normal on Echo.  He converted to NSR on one dose of flecainide 300 mg. Chest x-ray was abnormal and follow up has been recommended.  He was given prn Diltiazem to use for palpitations in the future.  CHADS2-VASc=0.  He therefore did not require long-term anticoagulation.   Since d/c, he is doing well.  The patient denies chest pain, shortness of breath, syncope, orthopnea, PND or significant pedal edema.   Studies:   - Echo (06/23/13):  Mild LVH, EF 60-65%, mild MR, mild RAE.   CXR (06/23/13): IMPRESSION: Patchy left upper lobe airspace opacity. Rounded pneumonia could have this appearance and may be seen in a relatively young age group. Followup to radiographic resolution is recommended in 4-6 weeks to document resolution   Recent Labs: 06/23/2013: Creatinine 1.11; Hemoglobin 18.3*; Potassium 4.5   Wt Readings from Last 3 Encounters:  07/11/13 184 lb (83.462 kg)  06/23/13 186 lb (84.369 kg)     Past Medical History  Diagnosis Date  . Dysrhythmia     atrial fibrilation    Current Outpatient Prescriptions  Medication Sig Dispense Refill  . diltiazem (CARDIZEM) 30 MG tablet Take 1 tablet (30 mg total) by mouth as needed (for palpitations/ fast heart rates).  30 tablet  3   No current facility-administered medications for this visit.    Allergies:   Review of patient's allergies indicates no known allergies.   Social History:  The patient  reports that he has been smoking Cigarettes.  He has a .5  pack-year smoking history. He has never used smokeless tobacco. He reports that he drinks alcohol. He reports that he does not use illicit drugs.   Family History:  The patient's family history is not on file.   ROS:  Please see the history of present illness.      All other systems reviewed and negative.   PHYSICAL EXAM: VS:  BP 110/70  Pulse 62  Ht 5\' 9"  (1.753 m)  Wt 184 lb (83.462 kg)  BMI 27.16 kg/m2 Well nourished, well developed, in no acute distress HEENT: normal Neck: no JVD Cardiac:  normal S1, S2; RRR; no murmur Lungs:  clear to auscultation bilaterally, no wheezing, rhonchi or rales Abd: soft, nontender, no hepatomegaly Ext: no edema Skin: warm and dry Neuro:  CNs 2-12 intact, no focal abnormalities noted  EKG:  NSR, HR 62, normal axis, LVH, early repol, no change from prior tracing     ASSESSMENT AND PLAN:  1. Atrial fibrillation:  Paroxysmal.  ? Related to recent strep pharyngitis.  No recurrence.  CHADS2-VASc=0.  He does not need anticoagulation Rx according to current guidelines.  He can use diltiazem prn for palps. 2. Abnormal CXR:  Arrange f/u CXR. 3. Tobacco abuse:  I have recommended he quit. 4. Disposition:  F/u with Dr. Olga MillersBrian Crenshaw in 3 mos.  Signed, Tereso NewcomerScott Diandre Merica, PA-C  07/11/2013 12:24 PM

## 2013-07-11 NOTE — Patient Instructions (Signed)
CHEST X-RAY TODAY AT [facility] ON [address]  Your physician wants you to follow-up in: 3 MONTHS WITH DR. CRENSHAW. You will receive a reminder letter in the mail two months in advance. If you don't receive a letter, please call our office to schedule the follow-up appointment.

## 2013-07-12 ENCOUNTER — Telehealth: Payer: Self-pay | Admitting: *Deleted

## 2013-07-12 DIAGNOSIS — R9389 Abnormal findings on diagnostic imaging of other specified body structures: Secondary | ICD-10-CM

## 2013-07-12 NOTE — Telephone Encounter (Signed)
Per Bing NeighborsScott W. PA need to refer pt to PCP

## 2013-07-12 NOTE — Telephone Encounter (Signed)
pt notified about cxr results and will have repeat cxr 6/12, will see Bing NeighborsScott W. PA on 6/15 @ 3:40, pt verbalized Plan of Care to all instructions.

## 2013-07-12 NOTE — Addendum Note (Signed)
Addended by: Tarri FullerFIATO, Raisa Ditto M on: 07/12/2013 11:45 AM   Modules accepted: Orders

## 2013-08-18 ENCOUNTER — Ambulatory Visit: Payer: Self-pay | Attending: Internal Medicine

## 2013-08-21 ENCOUNTER — Telehealth: Payer: Self-pay | Admitting: *Deleted

## 2013-08-21 ENCOUNTER — Ambulatory Visit: Payer: Self-pay | Admitting: Physician Assistant

## 2013-08-21 NOTE — Telephone Encounter (Signed)
appt made in error, pt was just supposed to f/u in 1 month for repeat CXR, and 3 months w/Dr. Jens Somrenshaw, pt not having any problems at this time per Aram Beechamynthia, pt's spouse, per SW ok to cancel appt. ..cmf

## 2013-10-24 ENCOUNTER — Ambulatory Visit: Payer: Self-pay | Admitting: Cardiovascular Disease

## 2015-03-13 ENCOUNTER — Encounter (HOSPITAL_COMMUNITY): Payer: Self-pay | Admitting: Emergency Medicine

## 2015-03-13 ENCOUNTER — Emergency Department (HOSPITAL_COMMUNITY)
Admission: EM | Admit: 2015-03-13 | Discharge: 2015-03-13 | Disposition: A | Discharge status: Home / Self Care | Payer: Self-pay | Attending: Emergency Medicine | Admitting: Emergency Medicine

## 2015-03-13 DIAGNOSIS — Z202 Contact with and (suspected) exposure to infections with a predominantly sexual mode of transmission: Secondary | ICD-10-CM | POA: Insufficient documentation

## 2015-03-13 DIAGNOSIS — R300 Dysuria: Secondary | ICD-10-CM | POA: Insufficient documentation

## 2015-03-13 DIAGNOSIS — Z8679 Personal history of other diseases of the circulatory system: Secondary | ICD-10-CM | POA: Insufficient documentation

## 2015-03-13 DIAGNOSIS — F17210 Nicotine dependence, cigarettes, uncomplicated: Secondary | ICD-10-CM | POA: Insufficient documentation

## 2015-03-13 LAB — URINALYSIS, ROUTINE W REFLEX MICROSCOPIC
BILIRUBIN URINE: NEGATIVE
Glucose, UA: NEGATIVE mg/dL
HGB URINE DIPSTICK: NEGATIVE
Ketones, ur: NEGATIVE mg/dL
Nitrite: NEGATIVE
Protein, ur: NEGATIVE mg/dL
SPECIFIC GRAVITY, URINE: 1.023 (ref 1.005–1.030)
pH: 6.5 (ref 5.0–8.0)

## 2015-03-13 LAB — URINE MICROSCOPIC-ADD ON: RBC / HPF: NONE SEEN RBC/hpf (ref 0–5)

## 2015-03-13 MED ORDER — CEFTRIAXONE SODIUM 250 MG IJ SOLR
250.0000 mg | Freq: Once | INTRAMUSCULAR | Status: AC
  Administered 2015-03-13: 250 mg via INTRAMUSCULAR
  Filled 2015-03-13: qty 250

## 2015-03-13 MED ORDER — ONDANSETRON 4 MG PO TBDP
4.0000 mg | ORAL_TABLET | Freq: Once | ORAL | Status: AC
  Administered 2015-03-13: 4 mg via ORAL
  Filled 2015-03-13: qty 1

## 2015-03-13 MED ORDER — AZITHROMYCIN 250 MG PO TABS
1000.0000 mg | ORAL_TABLET | Freq: Once | ORAL | Status: AC
  Administered 2015-03-13: 1000 mg via ORAL
  Filled 2015-03-13: qty 4

## 2015-03-13 MED ORDER — LIDOCAINE HCL (PF) 1 % IJ SOLN
0.9000 mL | Freq: Once | INTRAMUSCULAR | Status: AC
  Administered 2015-03-13: 0.9 mL
  Filled 2015-03-13: qty 5

## 2015-03-13 NOTE — ED Notes (Signed)
One touch patient see provider assessment.

## 2015-03-13 NOTE — ED Provider Notes (Signed)
CSN: [id number]     Arrival date & time 03/13/15  1714 History  By signing my name below, I, Placido Sou, attest that this documentation has been prepared under the direction and in the presence of Arthor Captain, PA-C. Electronically Signed: Placido Sou, ED Scribe. 03/13/2015. 5:55 PM.    Chief Complaint  Patient presents with  . Exposure to STD   The history is provided by the patient. No language interpreter was used.    HPI Comments: Sumedh Shinsato is a 25 y.o. male who presents to the Emergency Department complaining of mild, dysuria with onset 1 week ago. He describes the sensation as a "pinch". He is concerned of exposure to an STD with his male partner who he confirms currently having unprotected sex with. He notes a hx of smoking 2 PPD. Pt has no known drug allergies. Pt denies penile discharge, testicular pain, abd pain or any other associated symptoms at this time.   Past Medical History  Diagnosis Date  . Dysrhythmia     atrial fibrilation   History reviewed. No pertinent past surgical history. No family history on file. Social History  Substance Use Topics  . Smoking status: Heavy Tobacco Smoker -- 2.00 packs/day for 2 years    Types: Cigarettes  . Smokeless tobacco: Never Used  . Alcohol Use: No     Comment:      Review of Systems  Constitutional: Negative for fever and chills.  Gastrointestinal: Negative for abdominal pain.  Genitourinary: Positive for dysuria. Negative for testicular pain.    Allergies  Review of patient's allergies indicates no known allergies.  Home Medications   Prior to Admission medications   Medication Sig Start Date End Date Taking? Authorizing Provider  diltiazem (CARDIZEM) 30 MG tablet Take 1 tablet (30 mg total) by mouth as needed (for palpitations/ fast heart rates). 06/24/13   Brittainy M Simmons, PA-C   BP 148/81 mmHg  Pulse 95  Temp(Src) 98.2 F (36.8 C) (Oral)  Resp 16  Ht 5\' 10"  (1.778 m)  Wt 87.091 kg  BMI  27.55 kg/m2  SpO2 98%    Physical Exam  Constitutional: He is oriented to person, place, and time. He appears well-developed and well-nourished.  HENT:  Head: Normocephalic and atraumatic.  Neck: Normal range of motion.  Cardiovascular: Normal rate.   Pulmonary/Chest: Effort normal. No respiratory distress.  Abdominal: Soft.  Genitourinary:  Chaperone present nml male anatomy; circumcised penis; no testicular pain or swelling; no inguinal lymphadenopathy; no skin lesion; clear discharge at the urethral meatus   Musculoskeletal: Normal range of motion.  Neurological: He is alert and oriented to person, place, and time.  Skin: Skin is warm and dry. He is not diaphoretic.  Psychiatric: He has a normal mood and affect. His behavior is normal.  Nursing note and vitals reviewed.   ED Course  Procedures  DIAGNOSTIC STUDIES: Oxygen Saturation is 98% on RA, normal by my interpretation.    COORDINATION OF CARE: 5:51 PM Pt presents today due to possible STD exposure. Discussed next steps with pt and he is agreeable to the plan.   Labs Review Labs Reviewed  URINALYSIS, ROUTINE W REFLEX MICROSCOPIC (NOT AT [facility]) - Abnormal; Notable for the following:    Leukocytes, UA SMALL (*)    All other components within normal limits  URINE MICROSCOPIC-ADD ON - Abnormal; Notable for the following:    Squamous Epithelial / LPF 0-5 (*)    Bacteria, UA RARE (*)    All other components  within normal limits  GC/CHLAMYDIA PROBE AMP ([place]) NOT AT [facility] - Abnormal; Notable for the following:    Chlamydia **POSITIVE** (*)    Neisseria gonorrhea **POSITIVE** (*)    All other components within normal limits  RPR  HIV ANTIBODY (ROUTINE TESTING)    Imaging Review No results found. I have personally reviewed and evaluated these lab results as part of my medical decision-making.   EKG Interpretation None      MDM   Final diagnoses:  Exposure to STD    Patient treated in the ED for STI  with Zithromax and rocephin. Patient advised to inform and treat all sexual partners. Pt advised on safe sex practices and understands that they have GC/Chlamydia cultures pending and will result in 2-3 days. HIV and RPR sent. Pt encouraged to follow up at local health department for future STI checks. No concern for prostatitis or epididymitis. Discussed return precautions. Pt appears safe for discharge.   I personally performed the services described in this documentation, which was scribed in my presence. The recorded information has been reviewed and is accurate.      Arthor Captainbigail Myrla Malanowski, PA-C 03/18/15 1132  Loren Raceravid Yelverton, MD 03/19/15 [phone]

## 2015-03-13 NOTE — ED Notes (Signed)
Evaluation for possible STD.

## 2015-03-13 NOTE — Discharge Instructions (Signed)
Sexually Transmitted Disease °A sexually transmitted disease (STD) is a disease or infection that may be passed (transmitted) from person to person, usually during sexual activity. This may happen by way of saliva, semen, blood, vaginal mucus, or urine. Common STDs include: °· Gonorrhea. °· Chlamydia. °· Syphilis. °· HIV and AIDS. °· Genital herpes. °· Hepatitis B and C. °· Trichomonas. °· Human papillomavirus (HPV). °· Pubic lice. °· Scabies. °· Mites. °· Bacterial vaginosis. °WHAT ARE CAUSES OF STDs? °An STD may be caused by bacteria, a virus, or parasites. STDs are often transmitted during sexual activity if one person is infected. However, they may also be transmitted through nonsexual means. STDs may be transmitted after:  °· Sexual intercourse with an infected person. °· Sharing sex toys with an infected person. °· Sharing needles with an infected person or using unclean piercing or tattoo needles. °· Having intimate contact with the genitals, mouth, or rectal areas of an infected person. °· Exposure to infected fluids during birth. °WHAT ARE THE SIGNS AND SYMPTOMS OF STDs? °Different STDs have different symptoms. Some people may not have any symptoms. If symptoms are present, they may include: °· Painful or bloody urination. °· Pain in the pelvis, abdomen, vagina, anus, throat, or eyes. °· A skin rash, itching, or irritation. °· Growths, ulcerations, blisters, or sores in the genital and anal areas. °· Abnormal vaginal discharge with or without bad odor. °· Penile discharge in men. °· Fever. °· Pain or bleeding during sexual intercourse. °· Swollen glands in the groin area. °· Yellow skin and eyes (jaundice). This is seen with hepatitis. °· Swollen testicles. °· Infertility. °· Sores and blisters in the mouth. °HOW ARE STDs DIAGNOSED? °To make a diagnosis, your health care provider may: °· Take a medical history. °· Perform a physical exam. °· Take a sample of any discharge to examine. °· Swab the throat,  cervix, opening to the penis, rectum, or vagina for testing. °· Test a sample of your first morning urine. °· Perform blood tests. °· Perform a Pap test, if this applies. °· Perform a colposcopy. °· Perform a laparoscopy. °HOW ARE STDs TREATED? °Treatment depends on the STD. Some STDs may be treated but not cured. °· Chlamydia, gonorrhea, trichomonas, and syphilis can be cured with antibiotic medicine. °· Genital herpes, hepatitis, and HIV can be treated, but not cured, with prescribed medicines. The medicines lessen symptoms. °· Genital warts from HPV can be treated with medicine or by freezing, burning (electrocautery), or surgery. Warts may come back. °· HPV cannot be cured with medicine or surgery. However, abnormal areas may be removed from the cervix, vagina, or vulva. °· If your diagnosis is confirmed, your recent sexual partners need treatment. This is true even if they are symptom-free or have a negative culture or evaluation. They should not have sex until their health care providers say it is okay. °· Your health care provider may test you for infection again 3 months after treatment. °HOW CAN I REDUCE MY RISK OF GETTING AN STD? °Take these steps to reduce your risk of getting an STD: °· Use latex condoms, dental dams, and water-soluble lubricants during sexual activity. Do not use petroleum jelly or oils. °· Avoid having multiple sex partners. °· Do not have sex with someone who has other sex partners °· Do not have sex with anyone you do not know or who is at high risk for an STD. °· Avoid risky sex practices that can break your skin. °· Do not have sex   if you have open sores on your mouth or skin. °· Avoid drinking too much alcohol or taking illegal drugs. Alcohol and drugs can affect your judgment and put you in a vulnerable position. °· Avoid engaging in oral and anal sex acts. °· Get vaccinated for HPV and hepatitis. If you have not received these vaccines in the past, talk to your health care  provider about whether one or both might be right for you. °· If you are at risk of being infected with HIV, it is recommended that you take a prescription medicine daily to prevent HIV infection. This is called pre-exposure prophylaxis (PrEP). You are considered at risk if: °¨ You are a man who has sex with other men (MSM). °¨ You are a heterosexual man or woman and are sexually active with more than one partner. °¨ You take drugs by injection. °¨ You are sexually active with a partner who has HIV. °· Talk with your health care provider about whether you are at high risk of being infected with HIV. If you choose to begin PrEP, you should first be tested for HIV. You should then be tested every 3 months for as long as you are taking PrEP. °WHAT SHOULD I DO IF I THINK I HAVE AN STD? °· See your health care provider. °· Tell your sexual partner(s). They should be tested and treated for any STDs. °· Do not have sex until your health care provider says it is okay. °WHEN SHOULD I GET IMMEDIATE MEDICAL CARE? °Contact your health care provider right away if:  °· You have severe abdominal pain. °· You are a man and notice swelling or pain in your testicles. °· You are a woman and notice swelling or pain in your vagina. °  °This information is not intended to replace advice given to you by your health care provider. Make sure you discuss any questions you have with your health care provider. °  °Document Released: 05/16/2002 Document Revised: 03/16/2014 Document Reviewed: 09/13/2012 °Elsevier Interactive Patient Education ©2016 [employer] Inc[employer] ° °Free HIV and STD Testing °These locations offer FREE confidential testing for HIV, Chlamydia, Gonorrhea, and Syphilis. °Non-Traditional Testing Sites Address Telephone ° °[facility] [address], °[place] °[phone] °Mondays 5pm - 7pm ° °[facility] °[address], [address] °[place] °[phone] °Wednesdays  2pm-8pm ° °[facility] °[address], °[place] °[phone] °Thursdays 9am-12noon °1pm-4pm ° °[facility] °[address], [place] °[phone] °[phone] °Tuesdays °Thursdays °9am-12noon °1pm-4pm ° °[facility] offers free, confidential testing and treatment for HIV, Chlamydia, Gonorrhea, Syphilis, Herpes, Bacterial Vaginosis, Yeast, and Trichomoniasis. °Traditional Testing ° ° °[facility] °[address], °[place] °[phone]  °Monday thru Friday  °Call for an appointment ° °[facility] [place] °[facility] °[address] °[place] °[phone] °Monday thru Friday  °Call for anappointment. ° °If you have any questions about this information please call [phone]. °01/15/2011 ° ° °

## 2015-03-14 LAB — GC/CHLAMYDIA PROBE AMP (~~LOC~~) NOT AT ARMC
CHLAMYDIA, DNA PROBE: POSITIVE — AB
NEISSERIA GONORRHEA: POSITIVE — AB

## 2015-03-14 LAB — HIV ANTIBODY (ROUTINE TESTING W REFLEX): HIV SCREEN 4TH GENERATION: NONREACTIVE

## 2015-03-14 LAB — RPR: RPR Ser Ql: NONREACTIVE

## 2015-03-15 ENCOUNTER — Telehealth (HOSPITAL_BASED_OUTPATIENT_CLINIC_OR_DEPARTMENT_OTHER): Payer: Self-pay | Admitting: Emergency Medicine

## 2015-03-15 NOTE — Telephone Encounter (Signed)
+  GC, + Chlamydia, [facility] faxed, attempting to notify pt, phone number invalid, letter sent to address on file

## 2015-04-04 ENCOUNTER — Encounter (HOSPITAL_COMMUNITY): Payer: Self-pay | Admitting: Emergency Medicine

## 2015-04-04 ENCOUNTER — Emergency Department (HOSPITAL_COMMUNITY)
Admission: EM | Admit: 2015-04-04 | Discharge: 2015-04-04 | Disposition: A | Discharge status: Home / Self Care | Payer: Self-pay | Attending: Emergency Medicine | Admitting: Emergency Medicine

## 2015-04-04 DIAGNOSIS — Z8619 Personal history of other infectious and parasitic diseases: Secondary | ICD-10-CM | POA: Insufficient documentation

## 2015-04-04 DIAGNOSIS — N4889 Other specified disorders of penis: Secondary | ICD-10-CM | POA: Insufficient documentation

## 2015-04-04 DIAGNOSIS — I4891 Unspecified atrial fibrillation: Secondary | ICD-10-CM | POA: Insufficient documentation

## 2015-04-04 DIAGNOSIS — F17210 Nicotine dependence, cigarettes, uncomplicated: Secondary | ICD-10-CM | POA: Insufficient documentation

## 2015-04-04 DIAGNOSIS — Z202 Contact with and (suspected) exposure to infections with a predominantly sexual mode of transmission: Secondary | ICD-10-CM | POA: Insufficient documentation

## 2015-04-04 HISTORY — DX: Gonococcal infection, unspecified: A54.9

## 2015-04-04 MED ORDER — CEFTRIAXONE SODIUM 250 MG IJ SOLR
250.0000 mg | Freq: Once | INTRAMUSCULAR | Status: AC
  Administered 2015-04-04: 250 mg via INTRAMUSCULAR
  Filled 2015-04-04: qty 250

## 2015-04-04 MED ORDER — AZITHROMYCIN 250 MG PO TABS
1000.0000 mg | ORAL_TABLET | Freq: Once | ORAL | Status: AC
  Administered 2015-04-04: 1000 mg via ORAL
  Filled 2015-04-04: qty 4

## 2015-04-04 MED ORDER — LIDOCAINE HCL (PF) 1 % IJ SOLN
2.0000 mL | Freq: Once | INTRAMUSCULAR | Status: AC
  Administered 2015-04-04: 2 mL
  Filled 2015-04-04: qty 5

## 2015-04-04 NOTE — ED Provider Notes (Signed)
CSN: [id number]     Arrival date & time 04/04/15  1654 History  By signing my name below, I, Octavia Heir, attest that this documentation has been prepared under the direction and in the presence of Sharilyn Sites, PA-C. Electronically Signed: Octavia Heir, ED Scribe. 04/04/2015. 5:28 PM.    Chief Complaint  Patient presents with  . Exposure to STD      The history is provided by the patient. No language interpreter was used.   HPI Comments: Ricardo Boyer is a 25 y.o. male who presents to the Emergency Department complaining of constant, gradual worsening penile itching and exposure to STD onset one week ago. Pt states he was seen on 03/13/15 for the same problem. He notes his partner was seen and treated for the same STD. He admits he did not wait one full week to have intercourse with his partner while on his antibiotics. He denies fever and any other symptoms.  Past Medical History  Diagnosis Date  . Dysrhythmia     atrial fibrilation   No past surgical history on file. No family history on file. Social History  Substance Use Topics  . Smoking status: Heavy Tobacco Smoker -- 2.00 packs/day for 2 years    Types: Cigarettes  . Smokeless tobacco: Never Used  . Alcohol Use: No     Comment:      Review of Systems  Constitutional: Negative for fever.  Genitourinary:       Penile itching  All other systems reviewed and are negative.     Allergies  Review of patient's allergies indicates no known allergies.  Home Medications   Prior to Admission medications   Medication Sig Start Date End Date Taking? Authorizing Provider  diltiazem (CARDIZEM) 30 MG tablet Take 1 tablet (30 mg total) by mouth as needed (for palpitations/ fast heart rates). 06/24/13   Brittainy Sherlynn Carbon, PA-C   Triage vitals: BP 129/80 mmHg  Temp(Src) 98.3 F (36.8 C)  Resp 16  SpO2 98% Physical Exam  Constitutional: He is oriented to person, place, and time. He appears well-developed and  well-nourished.  HENT:  Head: Normocephalic and atraumatic.  Mouth/Throat: Oropharynx is clear and moist.  Eyes: Conjunctivae and EOM are normal. Pupils are equal, round, and reactive to light.  Neck: Normal range of motion.  Cardiovascular: Normal rate, regular rhythm and normal heart sounds.   Pulmonary/Chest: Effort normal and breath sounds normal.  Abdominal: Soft. Bowel sounds are normal.  Genitourinary:  deferred  Musculoskeletal: Normal range of motion.  Neurological: He is alert and oriented to person, place, and time.  Skin: Skin is warm and dry.  Psychiatric: He has a normal mood and affect.  Nursing note and vitals reviewed.   ED Course  Procedures  DIAGNOSTIC STUDIES: Oxygen Saturation is 98% on RA, normal by my interpretation.  COORDINATION OF CARE:  5:28 PM Discussed treatment plan with pt at bedside and pt agreed to plan.  Labs Review Labs Reviewed - No data to display  Imaging Review No results found. I have personally reviewed and evaluated these images and lab results as part of my medical decision-making.   EKG Interpretation None      MDM   Final diagnoses:  Possible exposure to STD   25 y.o. M here with penile itching.  He was treated for this earlier this month, both gonorrhea and Chlamydia cultures were positive at that time. Patient was treated in the ED, however he and partner continued having intercourse when she  was on antibiotics. I suspect he has contracted gonorrhea and/or chlamydia again. Will be retreated here in the ED with rocpehin and azithromycin. I strongly encouraged patient to refrain from sexual activity for 7- 10 days to ensure antibiotics have taken effect. His partner will also need to be retreated. I have also encouraged him to go to the health department for further STD needs and return here for emergent concerns.  I personally performed the services described in this documentation, which was scribed in my presence. The  recorded information has been reviewed and is accurate.  [name], PA-C 04/04/15 1834  Lyndal Pulley, MD 04/05/15 [phone]

## 2015-04-04 NOTE — ED Notes (Signed)
Pt was tx for STD's in Jan.  St's he had sex with same partner after that and now has symptoms again

## 2015-04-04 NOTE — Discharge Instructions (Signed)
In the future, you need to go to the health dept to be seen for potential STD exposure/treatment. Wait 7-10 days before sexual activity to ensure antibiotics have taken effect. Return here for emergent concerns.

## 2015-04-06 ENCOUNTER — Telehealth (HOSPITAL_COMMUNITY): Payer: Self-pay

## 2015-04-06 NOTE — Telephone Encounter (Signed)
Unable to contact pt by mail or telephone. Unable to communicate lab results or treatment changes. 

## 2015-06-03 ENCOUNTER — Encounter (HOSPITAL_COMMUNITY): Payer: Self-pay | Admitting: Emergency Medicine

## 2015-06-03 ENCOUNTER — Emergency Department (HOSPITAL_COMMUNITY)
Admission: EM | Admit: 2015-06-03 | Discharge: 2015-06-04 | Disposition: A | Discharge status: Home / Self Care | Payer: No Typology Code available for payment source | Attending: Emergency Medicine | Admitting: Emergency Medicine

## 2015-06-03 DIAGNOSIS — I4891 Unspecified atrial fibrillation: Secondary | ICD-10-CM | POA: Diagnosis not present

## 2015-06-03 DIAGNOSIS — Y9389 Activity, other specified: Secondary | ICD-10-CM | POA: Diagnosis not present

## 2015-06-03 DIAGNOSIS — S0990XA Unspecified injury of head, initial encounter: Secondary | ICD-10-CM | POA: Insufficient documentation

## 2015-06-03 DIAGNOSIS — Y998 Other external cause status: Secondary | ICD-10-CM | POA: Insufficient documentation

## 2015-06-03 DIAGNOSIS — Y92410 Unspecified street and highway as the place of occurrence of the external cause: Secondary | ICD-10-CM | POA: Insufficient documentation

## 2015-06-03 DIAGNOSIS — F17210 Nicotine dependence, cigarettes, uncomplicated: Secondary | ICD-10-CM | POA: Insufficient documentation

## 2015-06-03 DIAGNOSIS — S60221A Contusion of right hand, initial encounter: Secondary | ICD-10-CM | POA: Insufficient documentation

## 2015-06-03 DIAGNOSIS — Z8619 Personal history of other infectious and parasitic diseases: Secondary | ICD-10-CM | POA: Insufficient documentation

## 2015-06-03 DIAGNOSIS — S6991XA Unspecified injury of right wrist, hand and finger(s), initial encounter: Secondary | ICD-10-CM | POA: Diagnosis present
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
## 2015-06-04 ENCOUNTER — Emergency Department (HOSPITAL_COMMUNITY): Payer: No Typology Code available for payment source

## 2015-06-04 MED ORDER — IBUPROFEN 200 MG PO TABS
600.0000 mg | ORAL_TABLET | Freq: Once | ORAL | Status: AC
  Administered 2015-06-04: 600 mg via ORAL
  Filled 2015-06-04: qty 3

## 2015-06-04 MED ORDER — TRAMADOL HCL 50 MG PO TABS
50.0000 mg | ORAL_TABLET | Freq: Once | ORAL | Status: AC
  Administered 2015-06-04: 50 mg via ORAL
  Filled 2015-06-04: qty 1

## 2015-06-04 MED ORDER — IBUPROFEN 600 MG PO TABS: 600.0000 mg | ORAL_TABLET | Freq: Four times a day (QID) | ORAL | Status: DC | PRN

## 2015-06-04 MED ORDER — TRAMADOL HCL 50 MG PO TABS: 50.0000 mg | ORAL_TABLET | Freq: Four times a day (QID) | ORAL | Status: DC | PRN
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
## 2015-06-04 NOTE — Discharge Instructions (Signed)
Your x ray is normal Hand Contusion  A hand contusion is a deep bruise to the hand. Contusions happen when an injury causes bleeding under the skin. Signs of bruising include pain, puffiness (swelling), and discolored skin. The contusion may turn blue, purple, or yellow. HOME CARE  Put ice on the injured area.  Put ice in a plastic bag.  Place a towel between your skin and the bag.  Leave the ice on for 15-20 minutes, 03-04 times a day.  Only take medicines as told by your doctor.  Use an elastic wrap only as told. You may remove the wrap for sleeping, showering, and bathing. Take the wrap off if you lose feeling (have numbness) in your fingers, or they turn blue or cold. Put the wrap on more loosely.  Keep the hand raised (elevated) with pillows.  Avoid using your hand too much if it is painful. GET HELP RIGHT AWAY IF:   You have more redness, puffiness, or pain in your hand.  Your puffiness or pain does not get better with medicine.  You lose feeling in your hand, or you cannot move your fingers.  Your hand turns cold or blue.  You have pain when you move your fingers.  Your hand feels warm.  Your contusion does not get better in 2 days. MAKE SURE YOU:   Understand these instructions.  Will watch this condition.  Will get help right away if you are not doing well or you get worse.   This information is not intended to replace advice given to you by your health care provider. Make sure you discuss any questions you have with your health care provider.   Document Released: 08/12/2007 Document Revised: 03/16/2014 Document Reviewed: 08/17/2011 Elsevier Interactive Patient Education 2016 Elsevier Inc.  Cryotherapy Cryotherapy is when you put ice on your injury. Ice helps lessen pain and puffiness (swelling) after an injury. Ice works the best when you start using it in the first 24 to 48 hours after an injury. HOME CARE  Put a dry or damp towel between the ice pack  and your skin.  You may press gently on the ice pack.  Leave the ice on for no more than 10 to 20 minutes at a time.  Check your skin after 5 minutes to make sure your skin is okay.  Rest at least 20 minutes between ice pack uses.  Stop using ice when your skin loses feeling (numbness).  Do not use ice on someone who cannot tell you when it hurts. This includes small children and people with memory problems (dementia). GET HELP RIGHT AWAY IF:  You have white spots on your skin.  Your skin turns blue or pale.  Your skin feels waxy or hard.  Your puffiness gets worse. MAKE SURE YOU:   Understand these instructions.  Will watch your condition.  Will get help right away if you are not doing well or get worse.   This information is not intended to replace advice given to you by your health care provider. Make sure you discuss any questions you have with your health care provider.   Document Released: 08/12/2007 Document Revised: 05/18/2011 Document Reviewed: 10/16/2010 Elsevier Interactive Patient Education [employer] Else[employer] Inc.

## 2016-04-10 ENCOUNTER — Emergency Department (HOSPITAL_COMMUNITY)
Admission: EM | Admit: 2016-04-10 | Discharge: 2016-04-10 | Disposition: A | Discharge status: Home / Self Care | Payer: Self-pay | Attending: Emergency Medicine | Admitting: Emergency Medicine

## 2016-04-10 ENCOUNTER — Encounter (HOSPITAL_COMMUNITY): Payer: Self-pay

## 2016-04-10 DIAGNOSIS — Z202 Contact with and (suspected) exposure to infections with a predominantly sexual mode of transmission: Secondary | ICD-10-CM | POA: Insufficient documentation

## 2016-04-10 DIAGNOSIS — F17210 Nicotine dependence, cigarettes, uncomplicated: Secondary | ICD-10-CM | POA: Insufficient documentation

## 2016-04-10 LAB — URINALYSIS, ROUTINE W REFLEX MICROSCOPIC
BILIRUBIN URINE: NEGATIVE
GLUCOSE, UA: NEGATIVE mg/dL
HGB URINE DIPSTICK: NEGATIVE
Ketones, ur: NEGATIVE mg/dL
Leukocytes, UA: NEGATIVE
NITRITE: NEGATIVE
PH: 5 (ref 5.0–8.0)
Protein, ur: NEGATIVE mg/dL
SPECIFIC GRAVITY, URINE: 1.025 (ref 1.005–1.030)

## 2016-04-10 LAB — GC/CHLAMYDIA PROBE AMP (~~LOC~~) NOT AT ARMC
CHLAMYDIA, DNA PROBE: NEGATIVE
NEISSERIA GONORRHEA: NEGATIVE

## 2016-04-10 LAB — RPR: RPR Ser Ql: NONREACTIVE

## 2016-04-10 LAB — HIV ANTIBODY (ROUTINE TESTING W REFLEX): HIV Screen 4th Generation wRfx: NONREACTIVE

## 2016-04-10 MED ORDER — METRONIDAZOLE 500 MG PO TABS
2000.0000 mg | ORAL_TABLET | Freq: Once | ORAL | Status: AC
  Administered 2016-04-10: 2000 mg via ORAL
  Filled 2016-04-10: qty 4

## 2016-04-10 MED ORDER — STERILE WATER FOR INJECTION IJ SOLN
INTRAMUSCULAR | Status: AC
  Filled 2016-04-10: qty 10

## 2016-04-10 MED ORDER — CEFTRIAXONE SODIUM 250 MG IJ SOLR
250.0000 mg | Freq: Once | INTRAMUSCULAR | Status: AC
  Administered 2016-04-10: 250 mg via INTRAMUSCULAR
  Filled 2016-04-10: qty 250

## 2016-04-10 MED ORDER — AZITHROMYCIN 250 MG PO TABS
1000.0000 mg | ORAL_TABLET | Freq: Once | ORAL | Status: AC
  Administered 2016-04-10: 1000 mg via ORAL
  Filled 2016-04-10: qty 4

## 2016-04-10 NOTE — ED Triage Notes (Signed)
Pt endorses that his significant other tested positive for gonnorhea 3 days ago after they unprotected sex x 2 weeks ago. Pt denies any urinary sx. VSS.

## 2016-04-10 NOTE — ED Provider Notes (Signed)
[facility]-EMERGENCY DEPT Provider Note   CSN: [id number] Arrival date & time: 04/10/16  0005     History   Chief Complaint Chief Complaint  Patient presents with  . Exposure to STD    HPI Ricardo Boyer is a 26 y.o. male.  HPI    Ricardo MustacheOscar Boyer is a 26 y.o. male, with a history of Gonorrhea, presenting to the ED with Exposure to gonorrhea. Patient states that his sexual partner tested positive for gonorrhea 3 days ago. Last had unprotected sex with this person 2 weeks ago. Has not had sexual intercourse with anyone for the last 2 weeks. He denies dysuria, fever/chills, penile discharge, abdominal pain, pain with bowel movements, or any other complaints.     Past Medical History:  Diagnosis Date  . Dysrhythmia    atrial fibrilation  . Gonorrhea     Patient Active Problem List   Diagnosis Date Noted  . Abnormal CXR 07/11/2013  . Tobacco abuse 07/11/2013  . Atrial fibrillation (HCC) 06/23/2013    History reviewed. No pertinent surgical history.     Home Medications    Prior to Admission medications   Medication Sig Start Date End Date Taking? Authorizing Provider  diltiazem (CARDIZEM) 30 MG tablet Take 1 tablet (30 mg total) by mouth as needed (for palpitations/ fast heart rates). 06/24/13   Brittainy Sherlynn CarbonM Simmons, PA-C  ibuprofen (ADVIL,MOTRIN) 600 MG tablet Take 1 tablet (600 mg total) by mouth every 6 (six) hours as needed. 06/04/15   Earley FavorGail Schulz, NP  traMADol (ULTRAM) 50 MG tablet Take 1 tablet (50 mg total) by mouth every 6 (six) hours as needed. 06/04/15   Earley FavorGail Schulz, NP    Family History History reviewed. No pertinent family history.  Social History Social History  Substance Use Topics  . Smoking status: Heavy Tobacco Smoker    Packs/day: 0.50    Years: 2.00    Types: Cigarettes  . Smokeless tobacco: Never Used  . Alcohol use Yes     Comment: occ     Allergies   Patient has no known allergies.   Review of Systems Review of Systems    Constitutional: Negative for chills and fever.  Gastrointestinal: Negative for abdominal pain, nausea and vomiting.  Genitourinary: Negative for discharge, dysuria, hematuria, scrotal swelling and testicular pain.       STD exposure     Physical Exam Updated Vital Signs BP 144/86 (BP Location: Left Arm)   Pulse 91   Temp 98.6 F (37 C) (Oral)   Resp 18   Ht 5\' 9"  (1.753 m)   Wt 95.3 kg   SpO2 96%   BMI 31.01 kg/m   Physical Exam  Constitutional: He appears well-developed and well-nourished. No distress.  HENT:  Head: Normocephalic and atraumatic.  Eyes: Conjunctivae are normal.  Neck: Neck supple.  Cardiovascular: Normal rate and regular rhythm.   Pulmonary/Chest: Effort normal.  Abdominal: Soft. There is no tenderness. There is no guarding.  Genitourinary: Circumcised.  Genitourinary Comments: Penis, scrotum, and testicles without swelling, lesions, or tenderness. No penile discharge. Cremasteric reflex intact. Overall normal male genitalia. RN, Lisabeth PickKaleigh, served as chaperone during the exam.  Lymphadenopathy:       Right: No inguinal adenopathy present.       Left: No inguinal adenopathy present.  Neurological: He is alert.  Skin: Skin is warm and dry. He is not diaphoretic.  Psychiatric: He has a normal mood and affect. His behavior is normal.  Nursing note and vitals reviewed.  ED Treatments / Results  Labs (all labs ordered are listed, but only abnormal results are displayed) Labs Reviewed  URINALYSIS, ROUTINE W REFLEX MICROSCOPIC  RPR  HIV ANTIBODY (ROUTINE TESTING)  GC/CHLAMYDIA PROBE AMP ([place]) NOT AT [facility]    EKG  EKG Interpretation None       Radiology No results found.  Procedures Procedures (including critical care time)  Medications Ordered in ED Medications  sterile water (preservative free) injection (not administered)  cefTRIAXone (ROCEPHIN) injection 250 mg (250 mg Intramuscular Given 04/10/16 0105)  azithromycin (ZITHROMAX)  tablet 1,000 mg (1,000 mg Oral Given 04/10/16 0105)  metroNIDAZOLE (FLAGYL) tablet 2,000 mg (2,000 mg Oral Given 04/10/16 0105)     Initial Impression / Assessment and Plan / ED Course  I have reviewed the triage vital signs and the nursing notes.  Pertinent labs & imaging results that were available during my care of the patient were reviewed by me and considered in my medical decision making (see chart for details).     Patient presents with exposure to gonorrhea. Treated here in the ED. Patient knows he has results pending. Return precautions discussed.    Final Clinical Impressions(s) / ED Diagnoses   Final diagnoses:  STD exposure    New Prescriptions New Prescriptions   No medications on file     Concepcion Living 04/10/16 0115    Glynn Octave, MD 04/10/16 0900

## 2016-04-10 NOTE — Discharge Instructions (Signed)
You have been tested for STDs. Some of these results are still pending. Any abnormalities will be called to you. You have been prophylactically treated for gonorrhea, Chlamydia, and Trichomonas. This does not mean you necessarily have these diseases, treatment is precautionary. Be sure to follow safe sex practices, including monogamy and/or condom use. °

## 2016-12-17 ENCOUNTER — Encounter (HOSPITAL_COMMUNITY): Payer: Self-pay | Admitting: Emergency Medicine

## 2016-12-17 ENCOUNTER — Emergency Department (HOSPITAL_COMMUNITY)
Admission: EM | Admit: 2016-12-17 | Discharge: 2016-12-17 | Disposition: A | Discharge status: Home / Self Care | Payer: Self-pay | Attending: Emergency Medicine | Admitting: Emergency Medicine

## 2016-12-17 DIAGNOSIS — F17210 Nicotine dependence, cigarettes, uncomplicated: Secondary | ICD-10-CM | POA: Insufficient documentation

## 2016-12-17 DIAGNOSIS — Z79899 Other long term (current) drug therapy: Secondary | ICD-10-CM | POA: Insufficient documentation

## 2016-12-17 DIAGNOSIS — H1031 Unspecified acute conjunctivitis, right eye: Secondary | ICD-10-CM | POA: Insufficient documentation

## 2016-12-17 DIAGNOSIS — B9689 Other specified bacterial agents as the cause of diseases classified elsewhere: Secondary | ICD-10-CM | POA: Insufficient documentation

## 2016-12-17 MED ORDER — SULFACETAMIDE SODIUM 10 % OP SOLN: 2.0000 [drp] | OPHTHALMIC | 0 refills | Status: DC

## 2016-12-17 NOTE — ED Triage Notes (Signed)
Pt states his right eye became irritated at work yesterday  Pt states he woke up this morning and he has swelling to his eyelids on his right eye and drainage noted

## 2016-12-17 NOTE — Discharge Instructions (Signed)
Return here as needed.  Follow-up with the primary doctor as needed.  Use warm compresses over the eye

## 2016-12-17 NOTE — ED Provider Notes (Signed)
[facility]-EMERGENCY DEPT Provider Note   CSN: [id number] Arrival date & time: 12/17/16  4098     History   Chief Complaint Chief Complaint  Patient presents with  . Eye Drainage    HPI Ricardo Boyer is a 26 y.o. male.  HPI Patient presents to the emergency department with right eye irritation and drainage that started yesterday.  The patient states that he noticed that his eye was irritated at work and itching.  The patient states that he got worse and started noticing drainage from the eye.  Patient states that there is some mild discomfort associated with it.  The patient states he did not take any medications prior to arrival for his symptoms.  He states nothing seems make the condition better or worse.  Patient denies headache, blurred vision, nausea, vomiting, fever, weakness, dizziness or syncope Past Medical History:  Diagnosis Date  . Dysrhythmia    atrial fibrilation  . Gonorrhea     Patient Active Problem List   Diagnosis Date Noted  . Abnormal CXR 07/11/2013  . Tobacco abuse 07/11/2013  . Atrial fibrillation (HCC) 06/23/2013    History reviewed. No pertinent surgical history.     Home Medications    Prior to Admission medications   Medication Sig Start Date End Date Taking? Authorizing Provider  diltiazem (CARDIZEM) 30 MG tablet Take 1 tablet (30 mg total) by mouth as needed (for palpitations/ fast heart rates). 06/24/13   Robbie Lis M, PA-C  ibuprofen (ADVIL,MOTRIN) 600 MG tablet Take 1 tablet (600 mg total) by mouth every 6 (six) hours as needed. 06/04/15   Earley Favor, NP  traMADol (ULTRAM) 50 MG tablet Take 1 tablet (50 mg total) by mouth every 6 (six) hours as needed. 06/04/15   Earley Favor, NP    Family History History reviewed. No pertinent family history.  Social History Social History  Substance Use Topics  . Smoking status: Heavy Tobacco Smoker    Packs/day: 0.50    Years: 2.00    Types: Cigarettes  . Smokeless tobacco: Never Used    . Alcohol use No     Comment: occ     Allergies   Patient has no known allergies.   Review of Systems Review of Systems  All other systems negative except as documented in the HPI. All pertinent positives and negatives as reviewed in the HPI. Physical Exam Updated Vital Signs BP (!) 144/95 (BP Location: Left Arm)   Pulse 72   Temp 98.3 F (36.8 C) (Oral)   Resp 16   Ht  (1.753 m)   Wt 95.3 kg (210 lb)   SpO2 94%   BMI 31.01 kg/m   Physical Exam  Constitutional: He is oriented to person, place, and time. He appears well-developed and well-nourished. No distress.  HENT:  Head: Normocephalic and atraumatic.  Eyes: Pupils are equal, round, and reactive to light. Right eye exhibits discharge and exudate. Right conjunctiva is injected.  Pulmonary/Chest: Effort normal.  Neurological: He is alert and oriented to person, place, and time.  Skin: Skin is warm and dry.  Psychiatric: He has a normal mood and affect.  Nursing note and vitals reviewed.    ED Treatments / Results  Labs (all labs ordered are listed, but only abnormal results are displayed) Labs Reviewed - No data to display  EKG  EKG Interpretation None       Radiology No results found.  Procedures Procedures (including critical care time)  Medications Ordered in ED Medications -  No data to display   Initial Impression / Assessment and Plan / ED Course  I have reviewed the triage vital signs and the nursing notes.  Pertinent labs & imaging results that were available during my care of the patient were reviewed by me and considered in my medical decision making (see chart for details).     Patient will be treated for conjunctivitis.  Patient is advised return here as needed.  Told to use warm compresses over the eye.   Final Clinical Impressions(s) / ED Diagnoses   Final diagnoses:  None    New Prescriptions New Prescriptions   No medications on file     Charlestine Night,  Cordelia Poche 12/17/16 1610    Rolland Porter, MD 12/27/16 [phone]

## 2017-04-26 ENCOUNTER — Emergency Department (HOSPITAL_COMMUNITY)
Admission: EM | Admit: 2017-04-26 | Discharge: 2017-04-27 | Disposition: A | Discharge status: Home / Self Care | Payer: Self-pay | Attending: Emergency Medicine | Admitting: Emergency Medicine

## 2017-04-26 ENCOUNTER — Encounter (HOSPITAL_COMMUNITY): Payer: Self-pay

## 2017-04-26 DIAGNOSIS — F17210 Nicotine dependence, cigarettes, uncomplicated: Secondary | ICD-10-CM | POA: Insufficient documentation

## 2017-04-26 DIAGNOSIS — R369 Urethral discharge, unspecified: Secondary | ICD-10-CM

## 2017-04-26 DIAGNOSIS — Z202 Contact with and (suspected) exposure to infections with a predominantly sexual mode of transmission: Secondary | ICD-10-CM | POA: Insufficient documentation

## 2017-04-26 DIAGNOSIS — N489 Disorder of penis, unspecified: Secondary | ICD-10-CM | POA: Insufficient documentation

## 2017-04-26 LAB — URINALYSIS, ROUTINE W REFLEX MICROSCOPIC
Bilirubin Urine: NEGATIVE
GLUCOSE, UA: NEGATIVE mg/dL
Hgb urine dipstick: NEGATIVE
KETONES UR: NEGATIVE mg/dL
Nitrite: NEGATIVE
PROTEIN: NEGATIVE mg/dL
Specific Gravity, Urine: 1.017 (ref 1.005–1.030)
pH: 6 (ref 5.0–8.0)

## 2017-04-26 NOTE — ED Provider Notes (Signed)
[facility] EMERGENCY DEPARTMENT Provider Note   CSN: [id number] Arrival date & time: 04/26/17  1946     History   Chief Complaint Chief Complaint  Patient presents with  . Exposure to STD    HPI Ricardo Boyer is a 27 y.o. male with a hx of gonorrhea who presents to the ED with complaint of penile discharge that started yesterday. Patient states that discharge is clear to yellow. Discharge has been constant, no alleviating/aggravating factors. States he is having some pruritus to the distal aspect of the penis that is intermittent. Patient states hx of similar when diagnosed with trichomonas. Denies fevers, chills,  dysuria, frequency, urgency, testicular pain/swelling, abdominal pain, or pain with bowel movements. No recent new sexual partners that patient is aware of.   HPI  Past Medical History:  Diagnosis Date  . Dysrhythmia    atrial fibrilation  . Gonorrhea     Patient Active Problem List   Diagnosis Date Noted  . Abnormal CXR 07/11/2013  . Tobacco abuse 07/11/2013  . Atrial fibrillation (HCC) 06/23/2013    History reviewed. No pertinent surgical history.     Home Medications    Prior to Admission medications   Medication Sig Start Date End Date Taking? Authorizing Provider  diltiazem (CARDIZEM) 30 MG tablet Take 1 tablet (30 mg total) by mouth as needed (for palpitations/ fast heart rates). 06/24/13   Robbie Lis M, PA-C  ibuprofen (ADVIL,MOTRIN) 600 MG tablet Take 1 tablet (600 mg total) by mouth every 6 (six) hours as needed. 06/04/15   Earley Favor, NP  sulfacetamide (BLEPH-10) 10 % ophthalmic solution Place 2 drops into the right eye every 4 (four) hours. 12/17/16   Lawyer, Cristal Deer, PA-C  traMADol (ULTRAM) 50 MG tablet Take 1 tablet (50 mg total) by mouth every 6 (six) hours as needed. 06/04/15   Earley Favor, NP    Family History History reviewed. No pertinent family history.  Social History Social History   Tobacco Use    . Smoking status: Heavy Tobacco Smoker    Packs/day: 0.50    Years: 2.00    Pack years: 1.00    Types: Cigarettes  . Smokeless tobacco: Never Used  Substance Use Topics  . Alcohol use: No    Comment: occ  . Drug use: No     Allergies   Patient has no known allergies.   Review of Systems Review of Systems  Constitutional: Negative for chills and fever.  Respiratory: Negative for shortness of breath.   Cardiovascular: Negative for chest pain.  Gastrointestinal: Negative for abdominal pain, blood in stool, constipation, diarrhea, nausea and vomiting.  Genitourinary: Positive for discharge. Negative for dysuria, frequency, hematuria, scrotal swelling, testicular pain and urgency.       Positive for intermittent pruritus to the distal aspect of the penis.   Neurological: Negative for weakness and numbness.  All other systems reviewed and are negative.   Physical Exam Updated Vital Signs BP (!) 149/92   Pulse 82   Temp 98 F (36.7 C) (Oral)   Resp 18   SpO2 98%   Physical Exam  Constitutional: He appears well-developed and well-nourished. No distress.  HENT:  Head: Normocephalic and atraumatic.  Eyes: Conjunctivae are normal. Right eye exhibits no discharge. Left eye exhibits no discharge.  Abdominal: Soft. He exhibits no distension and no mass. There is no tenderness. There is no rebound and no guarding.  Genitourinary: Testes normal. Right testis shows no mass, no swelling and no tenderness.  Left testis shows no mass, no swelling and no tenderness. Circumcised. No penile erythema or penile tenderness. Discharge (Very minimal amount of clear to white discharge at the urethral meatus) found.  Genitourinary Comments: No lesions. RN Rushie Chestnut present as chaperone throughout exam.   Neurological: He is alert.  Clear speech.   Psychiatric: He has a normal mood and affect. His behavior is normal. Thought content normal.  Nursing note and vitals reviewed.   ED Treatments /  Results  Labs Results for orders placed or performed during the hospital encounter of 04/26/17  Wet prep, genital  Result Value Ref Range   Yeast Wet Prep HPF POC NONE SEEN NONE SEEN   Trich, Wet Prep NONE SEEN NONE SEEN   Clue Cells Wet Prep HPF POC NONE SEEN NONE SEEN   WBC, Wet Prep HPF POC FEW (A) NONE SEEN   Sperm NONE SEEN   Urinalysis, Routine w reflex microscopic  Result Value Ref Range   Color, Urine YELLOW YELLOW   APPearance CLEAR CLEAR   Specific Gravity, Urine 1.017 1.005 - 1.030   pH 6.0 5.0 - 8.0   Glucose, UA NEGATIVE NEGATIVE mg/dL   Hgb urine dipstick NEGATIVE NEGATIVE   Bilirubin Urine NEGATIVE NEGATIVE   Ketones, ur NEGATIVE NEGATIVE mg/dL   Protein, ur NEGATIVE NEGATIVE mg/dL   Nitrite NEGATIVE NEGATIVE   Leukocytes, UA TRACE (A) NEGATIVE   RBC / HPF 0-5 0 - 5 RBC/hpf   WBC, UA 6-30 0 - 5 WBC/hpf   Bacteria, UA RARE (A) NONE SEEN   Squamous Epithelial / LPF 0-5 (A) NONE SEEN  Rapid HIV screen (HIV 1/2 Ab+Ag) ([facility] Only)  Result Value Ref Range   HIV-1 P24 Antigen - HIV24 NON REACTIVE NON REACTIVE   HIV 1/2 Antibodies NON REACTIVE NON REACTIVE   Interpretation (HIV Ag Ab)      A non reactive test result means that HIV 1 or HIV 2 antibodies and HIV 1 p24 antigen were not detected in the specimen.   No results found. EKG  EKG Interpretation None       Radiology No results found.  Procedures Procedures (including critical care time)  Medications Ordered in ED Medications  cefTRIAXone (ROCEPHIN) injection 250 mg (not administered)  azithromycin (ZITHROMAX) tablet 1,000 mg (not administered)     Initial Impression / Assessment and Plan / ED Course  I have reviewed the triage vital signs and the nursing notes.  Pertinent labs & imaging results that were available during my care of the patient were reviewed by me and considered in my medical decision making (see chart for details).  Patient presents with complaint of penile discharge. Patient  is nontoxic appearing, in no apparent distress, vitals WNL other than elevated BP- no indication of HTN emergency. Patient is afebrile without abdominal tenderness, abdominal pain, or painful bowel movements to indicate prostatitis.  No tenderness to palpation of the testes or epididymis to suggest orchitis or epididymitis. Will evaluate with STD cultures including HIV, syphilis, gonorrhea and chlamydia. Will additionally evaluate with UA and wet prep.   01:08: RN discussed with lab regarding delay with wet prep results- was informed that lab did not receive the wet prep. Will repeat this.   Wet prep with few WBCs- no signs of trichomonas or yeast. UA without significant abnormality. Will treat empirically for gonorrhea and chlamydia with Ceftriaxone and Rocephin. Patient understands that they have GC/Chlamydia cultures as well as HIV and syphilis results pending and that they will need  to inform all sexual partners if results return positive.  Patient to be discharged with instructions to follow up with PCP or health department. Discussed importance of using protection when sexually active. I discussed results, treatment plan, need for PCP/health department follow-up, and return precautions with the patient. Provided opportunity for questions, patient confirmed understanding and is in agreement with plan.   Final Clinical Impressions(s) / ED Diagnoses   Final diagnoses:  Penile discharge    ED Discharge Orders    None       Cherly Andersonetrucelli, Theophil Thivierge R, PA-C 04/27/17 0138    Devoria AlbeKnapp, Iva, MD 04/27/17 [phone]

## 2017-04-26 NOTE — ED Triage Notes (Signed)
Reports yellowish discharge that started yesterday, denies urinary symptoms

## 2017-04-27 LAB — RAPID HIV SCREEN (HIV 1/2 AB+AG)
HIV 1/2 ANTIBODIES: NONREACTIVE
HIV-1 P24 Antigen - HIV24: NONREACTIVE

## 2017-04-27 LAB — WET PREP, GENITAL
CLUE CELLS WET PREP: NONE SEEN
SPERM: NONE SEEN
Trich, Wet Prep: NONE SEEN
YEAST WET PREP: NONE SEEN

## 2017-04-27 LAB — GC/CHLAMYDIA PROBE AMP (~~LOC~~) NOT AT ARMC
Chlamydia: POSITIVE — AB
Neisseria Gonorrhea: NEGATIVE

## 2017-04-27 LAB — HIV ANTIBODY (ROUTINE TESTING W REFLEX): HIV Screen 4th Generation wRfx: NONREACTIVE

## 2017-04-27 MED ORDER — AZITHROMYCIN 250 MG PO TABS
1000.0000 mg | ORAL_TABLET | Freq: Once | ORAL | Status: AC
  Administered 2017-04-27: 1000 mg via ORAL
  Filled 2017-04-27: qty 4

## 2017-04-27 MED ORDER — LIDOCAINE HCL (PF) 1 % IJ SOLN
INTRAMUSCULAR | Status: AC
  Administered 2017-04-27: 2 mL
  Filled 2017-04-27: qty 5

## 2017-04-27 MED ORDER — CEFTRIAXONE SODIUM 250 MG IJ SOLR
250.0000 mg | Freq: Once | INTRAMUSCULAR | Status: AC
  Administered 2017-04-27: 250 mg via INTRAMUSCULAR
  Filled 2017-04-27: qty 250

## 2017-04-27 NOTE — Discharge Instructions (Signed)
You were seen in the emergency department today for penile discharge. Your lab work thus far has not had any significant abnormalities. Your gonorrhea, chlamydia, and syphilis tests are pending at this time. We will call you with these results. You have been prophylactically treated for gonorrhea and chlamydia in the emergency department. If these tests results come back positive you will need to inform all sexual partners so that they  may seek evaluation. Do not participate in intercourse for the next 7 days to prevent spread of infection if these results are positive.   Follow up with the health department in 1 week for re-evaluation. Return to the ED if you start to experience worsening or new symptoms including, but not limited to fever, chills, abdominal pain, pain with bowel movements, testicular pain, or any other concerns.

## 2017-04-27 NOTE — ED Notes (Signed)
Lab stated they never received a wet prep.  Exam done again with PA.  Wet prep sent to lab with requisition.

## 2017-04-27 NOTE — ED Notes (Signed)
Patient Alert and oriented X4. Stable and ambulatory. Patient verbalized understanding of the discharge instructions.  Patient belongings were taken by the patient.  

## 2017-10-08 ENCOUNTER — Emergency Department (HOSPITAL_COMMUNITY)
Admission: EM | Admit: 2017-10-08 | Discharge: 2017-10-08 | Disposition: A | Discharge status: Home / Self Care | Payer: Self-pay | Attending: Emergency Medicine | Admitting: Emergency Medicine

## 2017-10-08 ENCOUNTER — Encounter (HOSPITAL_COMMUNITY): Payer: Self-pay | Admitting: Emergency Medicine

## 2017-10-08 DIAGNOSIS — R369 Urethral discharge, unspecified: Secondary | ICD-10-CM | POA: Insufficient documentation

## 2017-10-08 DIAGNOSIS — Z202 Contact with and (suspected) exposure to infections with a predominantly sexual mode of transmission: Secondary | ICD-10-CM | POA: Insufficient documentation

## 2017-10-08 DIAGNOSIS — F17210 Nicotine dependence, cigarettes, uncomplicated: Secondary | ICD-10-CM | POA: Insufficient documentation

## 2017-10-08 DIAGNOSIS — Z79899 Other long term (current) drug therapy: Secondary | ICD-10-CM | POA: Insufficient documentation

## 2017-10-08 LAB — URINALYSIS, ROUTINE W REFLEX MICROSCOPIC
BACTERIA UA: NONE SEEN
Bilirubin Urine: NEGATIVE
GLUCOSE, UA: NEGATIVE mg/dL
Ketones, ur: NEGATIVE mg/dL
LEUKOCYTES UA: NEGATIVE
NITRITE: NEGATIVE
Protein, ur: NEGATIVE mg/dL
SPECIFIC GRAVITY, URINE: 1.032 — AB (ref 1.005–1.030)
pH: 5 (ref 5.0–8.0)

## 2017-10-08 MED ORDER — LIDOCAINE HCL (PF) 1 % IJ SOLN
INTRAMUSCULAR | Status: AC
  Administered 2017-10-08: 20:00:00
  Filled 2017-10-08: qty 5

## 2017-10-08 MED ORDER — AZITHROMYCIN 250 MG PO TABS
1000.0000 mg | ORAL_TABLET | Freq: Once | ORAL | Status: AC
  Administered 2017-10-08: 1000 mg via ORAL
  Filled 2017-10-08: qty 4

## 2017-10-08 MED ORDER — CEFTRIAXONE SODIUM 250 MG IJ SOLR
250.0000 mg | Freq: Once | INTRAMUSCULAR | Status: AC
  Administered 2017-10-08: 250 mg via INTRAMUSCULAR
  Filled 2017-10-08: qty 250

## 2017-10-08 NOTE — ED Provider Notes (Signed)
[facility] Note   CSN: [id number] Arrival date & time: 10/08/17  1623     History   Chief Complaint Chief Complaint  Patient presents with  . Exposure to STD    HPI Ricardo MustacheOscar Boyer is a 27 y.o. male presenting for STD exposure.  Patient states that he was informed by his male partner that she tested positive for chlamydia, gonorrhea, trichomonas, genital herpes.  Patient states that he wishes to be tested for all of these diseases today as well as HIV and syphilis.    Patient denies having experienced symptoms including penile discharge/pain, testicular swelling/pain, rectal pain or bleeding, abdominal pain, fever, nausea/vomiting, diarrhea, open wounds or sores.   HPI  Past Medical History:  Diagnosis Date  . Dysrhythmia    atrial fibrilation  . Gonorrhea     Patient Active Problem List   Diagnosis Date Noted  . Abnormal CXR 07/11/2013  . Tobacco abuse 07/11/2013  . Atrial fibrillation (HCC) 06/23/2013    History reviewed. No pertinent surgical history.      Home Medications    Prior to Admission medications   Medication Sig Start Date End Date Taking? Authorizing Provider  diltiazem (CARDIZEM) 30 MG tablet Take 1 tablet (30 mg total) by mouth as needed (for palpitations/ fast heart rates). 06/24/13   Robbie LisSimmons, Brittainy M, PA-C  ibuprofen (ADVIL,MOTRIN) 600 MG tablet Take 1 tablet (600 mg total) by mouth every 6 (six) hours as needed. 06/04/15   Earley FavorSchulz, Gail, NP  sulfacetamide (BLEPH-10) 10 % ophthalmic solution Place 2 drops into the right eye every 4 (four) hours. 12/17/16   Lawyer, Cristal Deerhristopher, PA-C  traMADol (ULTRAM) 50 MG tablet Take 1 tablet (50 mg total) by mouth every 6 (six) hours as needed. 06/04/15   Earley FavorSchulz, Gail, NP    Family History History reviewed. No pertinent family history.  Social History Social History   Tobacco Use  . Smoking status: Heavy Tobacco Smoker    Packs/day: 0.50    Years:  2.00    Pack years: 1.00    Types: Cigarettes  . Smokeless tobacco: Never Used  Substance Use Topics  . Alcohol use: No    Comment: occ  . Drug use: No     Allergies   Patient has no known allergies.   Review of Systems Review of Systems  Constitutional: Negative.  Negative for fever.  Genitourinary: Negative.  Negative for discharge, dysuria, genital sores, hematuria, penile pain, scrotal swelling and testicular pain.  Musculoskeletal: Negative.  Negative for arthralgias and myalgias.  Skin: Negative.  Negative for rash and wound.     Physical Exam Updated Vital Signs BP (!) 143/100 (BP Location: Right Arm)   Pulse 98   Temp 99.3 F (37.4 C) (Oral)   Resp 18   SpO2 99%   Physical Exam  Constitutional: He is oriented to person, place, and time. He appears well-developed and well-nourished. No distress.  HENT:  Head: Normocephalic and atraumatic.  Right Ear: External ear normal.  Left Ear: External ear normal.  Nose: Nose normal.  Eyes: Pupils are equal, round, and reactive to light. EOM are normal.  Neck: Trachea normal and normal range of motion. No tracheal deviation present.  Pulmonary/Chest: Effort normal. No respiratory distress.  Abdominal: Soft. There is no tenderness. There is no rebound and no guarding. Hernia confirmed negative in the right inguinal area and confirmed negative in the left inguinal area.  Genitourinary: Testes normal and penis normal. Cremasteric reflex is  present. Right testis shows no swelling and no tenderness. Cremasteric reflex is not absent on the right side. Left testis shows no swelling and no tenderness. Cremasteric reflex is not absent on the left side. No penile tenderness.  Genitourinary Comments: Chaperone present during genital exam RN Katellyn  No external genital lesions noted, no bumps on head of penis, specifically no vesicles concerning for herpes or chancre suggestive of syphilis.  No pain with palpation of the penis/glans,  no discharge or urethritis noted.  Scrotum and testicles without erythema/swelling or tenderness to palpation. Cremasteric reflex intact bilaterally. No palpable hernia noted.   Musculoskeletal: Normal range of motion.  Neurological: He is alert and oriented to person, place, and time.  Skin: Skin is warm and dry.  Psychiatric: He has a normal mood and affect. His behavior is normal.     ED Treatments / Results  Labs (all labs ordered are listed, but only abnormal results are displayed) Labs Reviewed  RPR  HIV ANTIBODY (ROUTINE TESTING)  URINALYSIS, ROUTINE W REFLEX MICROSCOPIC  HSV 1 ANTIBODY, IGG  HSV 2 ANTIBODY, IGG  GC/CHLAMYDIA PROBE AMP ([place]) NOT AT [facility]    EKG None  Radiology No results found.  Procedures Procedures (including critical care time)  Medications Ordered in ED Medications  cefTRIAXone (ROCEPHIN) injection 250 mg (has no administration in time range)  azithromycin (ZITHROMAX) tablet 1,000 mg (has no administration in time range)     Initial Impression / Assessment and Plan / ED Course  I have reviewed the triage vital signs and the nursing notes.  Pertinent labs & imaging results that were available during my care of the patient were reviewed by me and considered in my medical decision making (see chart for details).     Patient treated in the ED for STI with Rocephin and Azithromycin. Patient advised to inform and treat all sexual partners.  Pt advised on safe sex practices and understands that they have GC/Chlamydia cultures pending and will result in 2-3 days. HIV and RPR sent in addition to HSV. UA today did no show the presence of Trichomonas. Patient encouraged to follow up at local health department for future STI checks. No concern for prostatitis or epididymitis at this time. Discussed return precautions. Pt appears safe for discharge.   At this time there does not appear to be any evidence of an acute emergency medical condition and  the patient appears stable for discharge with appropriate outpatient follow up. Diagnosis was discussed with patient who verbalizes understanding of care plan and is agreeable to discharge. I have discussed return precautions with patient who verbalizes understanding of return precautions. Patient strongly encouraged to follow-up with their PCP. All questions answered.   Note: Portions of this report may have been transcribed using voice recognition software. Every effort was made to ensure accuracy; however, inadvertent computerized transcription errors may still be present.    Final Clinical Impressions(s) / ED Diagnoses   Final diagnoses:  None    ED Discharge Orders    None       Elizabeth Palau 10/08/17 2042    Melene Plan, DO 10/08/17 2343

## 2017-10-08 NOTE — Discharge Instructions (Signed)
You have been treated in the emergency department for an infection, possibly sexually transmitted. Results of your gonorrhea and chlamydia tests as well as HIV/Syphilis and Herpes are pending and you will be notified if they are positive. It is very important to practice safe sex and use condoms when sexually active. If your results are positive you need to notify all sexual partners so they can be treated as well. The website [email or web address] can be used to send anonymous text messages or emails to alert sexual contacts. Follow up with your doctor, or OBGYN in regards to today's visit.    Gonorrhea and Chlamydia SYMPTOMS  In females, symptoms may go unnoticed. Symptoms that are more noticeable can include:  Belly (abdominal) pain.  Painful intercourse.  Watery mucous-like discharge from the vagina.  Miscarriage.  Discomfort when urinating.  Inflammation of the rectum.  Abnormal gray-green frothy vaginal discharge  Vaginal itching and irritatio  Itching and irritation of the area outside the vagina.   Painful urination.  Bleeding after sexual intercourse.  In males, symptoms include:  Burning with urination.  Pain in the testicles.  Watery mucous-like discharge from the penis.  It can cause longstanding (chronic) pelvic pain after frequent infections.  TREATMENT  PID can cause women to not be able to have children (sterile) if left untreated or if half-treated.  It is important to finish ALL medications given to you.  This is a sexually transmitted infection. So you are also at risk for other sexually transmitted diseases, including HIV (AIDS), it is recommended that you get tested. HOME CARE INSTRUCTIONS  Warning: This infection is contagious. Do not have sex until treatment is completed. Follow up at your caregiver's office or the clinic to which you were referred. If your diagnosis (learning what is wrong) is confirmed by culture or some other method, your recent sexual  contacts need treatment. Even if they are symptom free or have a negative culture or evaluation, they should be treated.  PREVENTION  Women should use sanitary pads instead of tampons for vaginal discharge.  Wipe front to back after using the toilet and avoid douching.   Practice safe sex, use condoms, have only one sex partner and be sure your sex partner is not having sex with others.  Ask your caregiver to test you for chlamydia at your regular checkups or sooner if you are having symptoms.  Ask for further information if you are pregnant.  SEEK IMMEDIATE MEDICAL CARE IF:  You develop an oral temperature above 102 F (38.9 C), not controlled by medications or lasting more than 2 days.  You develop an increase in pain.  You develop any type of abnormal discharge.  You develop vaginal bleeding and it is not time for your period.  You develop painful intercourse.   RESOURCE GUIDE  Dental Problems  Patients with Medicaid: [facility]                     [facility] [phone]                                           [phone] [address] Phone:  [phone]  Phone:  [phone]  If unable to pay or uninsured, contact:  [facility] or [facility]. to become qualified for the adult dental clinic.  Chronic Pain Problems Contact [facility]  [phone] Patients need to be referred by their primary care doctor.  Insufficient Money for Medicine Contact United Way:  call "211" or [facility] (226)[phone].  No Primary Care Doctor Call [facility]  [phone] Other agencies that provide inexpensive medical care    [facility]  [phone]    [facility]  [phone]    [facility]  [phone]    [facility]  [phone]    [facility]  [phone]    [facility]  [phone]  Psychological Services [facility] Behavioral Health   [phone] [facility] Services  [phone] [facility] Mental Health   [phone] (emergency services [phone])  Substance Abuse Resources Alcohol and Drug Services  [phone] [facility] [phone] [facility] [phone] Floydene Flock [phone] Residential & Outpatient Substance Abuse Program  [phone]  Abuse/Neglect [facility] Child Abuse Hotline 617-717-8521 [facility] Child Abuse Hotline 212 159 7738 (After Hours)  Emergency Shelter [facility] Ministries [phone])[phone]  Maternity Homes Room at the [place] [facility] [place] [phone] [facility] [phone]) [phone]  MRSA Hotline #:   819-848-7117    [facility] Resources  [facility] [place]     United Way                          [facility] Dept. [address] [place]                       [address]      [address]  Blondell Reveal Phone:  [phone]                                   Phone:  [phone]                 Phone:  [phone]  [facility] Mental Health Phone:  [phone]  [facility] Child Abuse Hotline 219-283-0768 4324508900 (After Hours)   Some of these tests do not come back for 1-2 days in which case if they turn positive you will receive a phone call to let you know. If you are contacted and do have an infection consistent with a sexually transmitted disease, then you will need to tell any and all sexual partners that you have had in the last 6 months no so that they can be tested and treated as well. If you should develop severe or worsening pain in your abdomen or the pelvis or develop severe fevers,nausea or vomiting that prevent you from taking your medications, return to the emergency department immediately. Otherwise contact your local physician or county health department for a follow up  appointment  to complete STD testing including HIV and syphilis.  See the list of phone numbers below.  RESOURCE GUIDE  Dental Problems  Patients with Medicaid: [facility] [phone] [address]                                           [phone] [address] Phone:  [phone]                                                  Phone:  [phone]  If unable to pay or uninsured, contact:  [facility] or [facility]. to become qualified for the adult dental clinic.  Chronic Pain Problems Contact [facility]  [phone] Patients need to be referred by their primary care doctor.  Insufficient Money for Medicine Contact United Way:  call "211" or [facility] [phone]) [phone].  No Primary Care Doctor Call [facility]  [phone] Other agencies that provide inexpensive medical care    [facility]  [phone]    [facility] Internal Medicine  [phone]    [facility]  [phone]    [facility]  [phone]    [facility]  [phone]    [facility] Child Clinic  [phone]  Psychological Services [facility] Behavioral Health  [phone] [facility] Services  [phone] [facility] Mental Health   [phone] (emergency services [phone])  Substance Abuse Resources Alcohol and Drug Services  [phone] [facility] [phone] [facility] [place] [phone] [facility] Flock [phone] Residential & Outpatient Substance Abuse Program  [phone]  Abuse/Neglect [facility] Child Abuse Hotline (318) 631-1827 [facility] Child Abuse Hotline 432-409-4212 (After Hours)  Emergency Shelter [facility] Ministries [phone]  Maternity Homes Room at the [place] [facility] [place] [phone] [facility] [phone]  MRSA Hotline #:   (231)413-4779    [facility] Resources  [facility] [place]     United Way                           [facility] Dept. [address] [place]                       [address]      [address]  [place]                                                Cristobal Goldmann Phone:  [phone]                                   Phone:  [phone]  Phone:  [phone]  [facility] Mental Health Phone:  [phone]  [facility] Child Abuse Hotline 920-740-3965 [phone] (After Hours)

## 2017-10-08 NOTE — ED Triage Notes (Signed)
Pt presents to ED for assessment after being told one of his sexual partners tested positive for genital herpes, chlamydia, gonorrhea, trichomonas.  Pt denies any symptoms at this time

## 2017-10-08 NOTE — ED Notes (Signed)
Patient able to ambulate independently  

## 2017-10-09 LAB — RPR: RPR: NONREACTIVE

## 2017-10-09 LAB — HIV ANTIBODY (ROUTINE TESTING W REFLEX): HIV SCREEN 4TH GENERATION: NONREACTIVE

## 2017-10-10 LAB — HSV 1 ANTIBODY, IGG: HSV 1 Glycoprotein G Ab, IgG: 44.8 index — ABNORMAL HIGH (ref 0.00–0.90)

## 2017-10-10 LAB — HSV 2 ANTIBODY, IGG

## 2017-10-11 LAB — GC/CHLAMYDIA PROBE AMP (~~LOC~~) NOT AT ARMC
Chlamydia: NEGATIVE
Neisseria Gonorrhea: NEGATIVE

## 2018-02-08 ENCOUNTER — Emergency Department (HOSPITAL_COMMUNITY)
Admission: EM | Admit: 2018-02-08 | Discharge: 2018-02-08 | Disposition: A | Discharge status: Home / Self Care | Payer: Self-pay | Attending: Emergency Medicine | Admitting: Emergency Medicine

## 2018-02-08 ENCOUNTER — Encounter (HOSPITAL_COMMUNITY): Payer: Self-pay | Admitting: Emergency Medicine

## 2018-02-08 ENCOUNTER — Other Ambulatory Visit: Payer: Self-pay

## 2018-02-08 DIAGNOSIS — Z5321 Procedure and treatment not carried out due to patient leaving prior to being seen by health care provider: Secondary | ICD-10-CM | POA: Insufficient documentation

## 2018-02-08 DIAGNOSIS — R369 Urethral discharge, unspecified: Secondary | ICD-10-CM

## 2018-02-08 DIAGNOSIS — Z79899 Other long term (current) drug therapy: Secondary | ICD-10-CM | POA: Insufficient documentation

## 2018-02-08 DIAGNOSIS — F17210 Nicotine dependence, cigarettes, uncomplicated: Secondary | ICD-10-CM | POA: Insufficient documentation

## 2018-02-08 DIAGNOSIS — A749 Chlamydial infection, unspecified: Secondary | ICD-10-CM | POA: Insufficient documentation

## 2018-02-08 LAB — URINALYSIS, ROUTINE W REFLEX MICROSCOPIC
Bilirubin Urine: NEGATIVE
GLUCOSE, UA: NEGATIVE mg/dL
Hgb urine dipstick: NEGATIVE
Ketones, ur: NEGATIVE mg/dL
NITRITE: NEGATIVE
Protein, ur: NEGATIVE mg/dL
Specific Gravity, Urine: 1.023 (ref 1.005–1.030)
WBC, UA: >50 WBC/hpf — ABNORMAL HIGH (ref 0–5)
pH: 6 (ref 5.0–8.0)

## 2018-02-08 LAB — GC/CHLAMYDIA PROBE AMP (~~LOC~~) NOT AT ARMC
Chlamydia: POSITIVE — AB
NEISSERIA GONORRHEA: NEGATIVE

## 2018-02-08 MED ORDER — AZITHROMYCIN 250 MG PO TABS
1000.0000 mg | ORAL_TABLET | Freq: Once | ORAL | Status: AC
  Administered 2018-02-08: 1000 mg via ORAL
  Filled 2018-02-08: qty 4

## 2018-02-08 MED ORDER — CEFTRIAXONE SODIUM 250 MG IJ SOLR
250.0000 mg | Freq: Once | INTRAMUSCULAR | Status: AC
  Administered 2018-02-08: 250 mg via INTRAMUSCULAR
  Filled 2018-02-08: qty 250

## 2018-02-08 NOTE — ED Triage Notes (Signed)
Pt reports penile discharge X2 weeks.  Reports burning while urinating.

## 2018-02-08 NOTE — ED Notes (Signed)
Patient verbalizes understanding of discharge instructions. Opportunity for questioning and answers were provided. Armband removed by staff, pt discharged from ED ambulatory by self in NAD

## 2018-02-08 NOTE — ED Notes (Signed)
Pt did not answer x 3 

## 2018-02-08 NOTE — ED Triage Notes (Signed)
Pt reports white penile discharge that has been going on for the last 2 weeks. Pt reports burning sensation with urination. Hx of gonorrhea.

## 2018-02-08 NOTE — ED Provider Notes (Signed)
[facility] Provider Note   CSN: [id number] Arrival date & time: 02/08/18  1948     History   Chief Complaint Chief Complaint  Patient presents with  . Penile Discharge    HPI Ricardo Boyer is a 27 y.o. male.  The history is provided by the patient and medical records.  Penile Discharge     27 year old male with history of A. fib, gonorrhea, presenting to the ED with penile discharge.  Reports this is been ongoing for the past 2 weeks.  He reports intermittent dysuria when urinating.  He denies any hematuria or difficulty urinating.  States discharge is like white in color.  Girlfriend has been diagnosed with genital herpes but he has not noticed any outbreak.  She is not experiencing any discharge.  He has not had any new sexual partner, this is the same partner of over a year.  He does report history of gonorrhea.  Past Medical History:  Diagnosis Date  . Dysrhythmia    atrial fibrilation  . Gonorrhea     Patient Active Problem List   Diagnosis Date Noted  . Abnormal CXR 07/11/2013  . Tobacco abuse 07/11/2013  . Atrial fibrillation (HCC) 06/23/2013    History reviewed. No pertinent surgical history.      Home Medications    Prior to Admission medications   Medication Sig Start Date End Date Taking? Authorizing Provider  diltiazem (CARDIZEM) 30 MG tablet Take 1 tablet (30 mg total) by mouth as needed (for palpitations/ fast heart rates). 06/24/13   Robbie LisSimmons, Brittainy M, PA-C  ibuprofen (ADVIL,MOTRIN) 600 MG tablet Take 1 tablet (600 mg total) by mouth every 6 (six) hours as needed. 06/04/15   Earley FavorSchulz, Gail, NP  sulfacetamide (BLEPH-10) 10 % ophthalmic solution Place 2 drops into the right eye every 4 (four) hours. 12/17/16   Lawyer, Cristal Deerhristopher, PA-C  traMADol (ULTRAM) 50 MG tablet Take 1 tablet (50 mg total) by mouth every 6 (six) hours as needed. 06/04/15   Earley FavorSchulz, Gail, NP    Family History No family history on  file.  Social History Social History   Tobacco Use  . Smoking status: Heavy Tobacco Smoker    Packs/day: 1.00    Years: 2.00    Pack years: 2.00    Types: Cigarettes  . Smokeless tobacco: Never Used  Substance Use Topics  . Alcohol use: No    Comment: occ  . Drug use: No     Allergies   Patient has no known allergies.   Review of Systems Review of Systems  Genitourinary: Positive for discharge.  All other systems reviewed and are negative.    Physical Exam Updated Vital Signs BP (!) 145/94 (BP Location: Left Arm)   Pulse 91   Temp 98.9 F (37.2 C) (Oral)   Resp 18   SpO2 97%   Physical Exam  Constitutional: He is oriented to person, place, and time. He appears well-developed and well-nourished.  HENT:  Head: Normocephalic and atraumatic.  Mouth/Throat: Oropharynx is clear and moist.  Eyes: Pupils are equal, round, and reactive to light. Conjunctivae and EOM are normal.  Neck: Normal range of motion.  Cardiovascular: Normal rate, regular rhythm and normal heart sounds.  Pulmonary/Chest: Effort normal and breath sounds normal. No stridor. No respiratory distress.  Abdominal: Soft. Bowel sounds are normal.  Genitourinary: Discharge found.  Genitourinary Comments: No penile lesions, small amount of white discharged noted at the urethral meatus; no testicle swelling or tenderness  Musculoskeletal:  Normal range of motion.  Neurological: He is alert and oriented to person, place, and time.  Skin: Skin is warm and dry.  Psychiatric: He has a normal mood and affect.  Nursing note and vitals reviewed.    ED Treatments / Results  Labs (all labs ordered are listed, but only abnormal results are displayed) Labs Reviewed  URINALYSIS, ROUTINE W REFLEX MICROSCOPIC - Abnormal; Notable for the following components:      Result Value   APPearance HAZY (*)    Leukocytes, UA MODERATE (*)    WBC, UA >50 (*)    Bacteria, UA RARE (*)    All other components within normal  limits    EKG None  Radiology No results found.  Procedures Procedures (including critical care time)  Medications Ordered in ED Medications  cefTRIAXone (ROCEPHIN) injection 250 mg (250 mg Intramuscular Given 02/08/18 2243)  azithromycin (ZITHROMAX) tablet 1,000 mg (1,000 mg Oral Given 02/08/18 2243)     Initial Impression / Assessment and Plan / ED Course  I have reviewed the triage vital signs and the nursing notes.  Pertinent labs & imaging results that were available during my care of the patient were reviewed by me and considered in my medical decision making (see chart for details).  27 y.o. M here with penile discharge x2 weeks along with some intermittent dysuria.  Denies new sexual partner.  Girlfriend has genital herpes.  He denies any penile lesions/rash.  Exam does reveal some white penile discharge at the urethral meatus.  UA with large leuks.  Gc/chl pending.  Treated empirically with rocephin and azithromycin.  Will be notified in 24-48 hours of culture results, if positive partner will need to be tested as well.  Encouraged follow-up with health dept.  Can return here for any new/acute changes.  Final Clinical Impressions(s) / ED Diagnoses   Final diagnoses:  Penile discharge    ED Discharge Orders    None       [name], PA-C 02/08/18 2258    Tilden Fossa, MD 02/09/18 0030

## 2018-02-08 NOTE — Discharge Instructions (Signed)
We will call you if your test results are positive but you have already been treated.  Partner may need to be tested/treated if positive. You can follow-up with the health dept if any ongoing issues. Return here for new concerns.

## 2018-02-09 LAB — GC/CHLAMYDIA PROBE AMP (~~LOC~~) NOT AT ARMC
Chlamydia: POSITIVE — AB
NEISSERIA GONORRHEA: NEGATIVE

## 2018-08-10 ENCOUNTER — Emergency Department (HOSPITAL_COMMUNITY)
Admission: EM | Admit: 2018-08-10 | Discharge: 2018-08-10 | Disposition: A | Discharge status: Home / Self Care | Payer: Managed Care, Other (non HMO) | Attending: Emergency Medicine | Admitting: Emergency Medicine

## 2018-08-10 ENCOUNTER — Other Ambulatory Visit: Payer: Self-pay

## 2018-08-10 ENCOUNTER — Encounter (HOSPITAL_COMMUNITY): Payer: Self-pay | Admitting: *Deleted

## 2018-08-10 DIAGNOSIS — L02512 Cutaneous abscess of left hand: Secondary | ICD-10-CM | POA: Diagnosis not present

## 2018-08-10 DIAGNOSIS — F17210 Nicotine dependence, cigarettes, uncomplicated: Secondary | ICD-10-CM | POA: Diagnosis not present

## 2018-08-10 DIAGNOSIS — L089 Local infection of the skin and subcutaneous tissue, unspecified: Secondary | ICD-10-CM | POA: Diagnosis present

## 2018-08-10 MED ORDER — MUPIROCIN CALCIUM 2 % EX CREA
TOPICAL_CREAM | Freq: Once | CUTANEOUS | Status: AC
  Administered 2018-08-10: 09:00:00 via TOPICAL
  Filled 2018-08-10: qty 15

## 2018-08-10 MED ORDER — SULFAMETHOXAZOLE-TRIMETHOPRIM 800-160 MG PO TABS: 1.0000 | ORAL_TABLET | Freq: Two times a day (BID) | ORAL | 0 refills | Status: AC

## 2018-08-10 MED ORDER — LIDOCAINE HCL (PF) 1 % IJ SOLN
5.0000 mL | Freq: Once | INTRAMUSCULAR | Status: AC
  Administered 2018-08-10: 5 mL
  Filled 2018-08-10: qty 5

## 2018-08-10 NOTE — ED Provider Notes (Signed)
[facility] EM[facility]ENC[facility] Provider Note   CSN: [id number] Arrival date & time: 08/10/18  [id number]    History   Chief Complaint Chief Complaint  Patient presents with  . Finger Injury    HPI Ricardo Boyer is a 28 y.o. male.     Patient is a 28 year old male with past medical history of A. fib, presenting to the emergency department for left pinky finger infection.  Reports that he noticed this about 2 days ago and it seems to be getting worse.  Denies any fever, chills, nausea, vomiting.  Denies any known injury or trauma.  Reports he thinks it looks like a spider bite but he did not see any type of insect bite him.     Past Medical History:  Diagnosis Date  . Dysrhythmia    atrial fibrilation  . Gonorrhea     Patient Active Problem List   Diagnosis Date Noted  . Abnormal CXR 07/11/2013  . Tobacco abuse 07/11/2013  . Atrial fibrillation (HCC) 06/23/2013    History reviewed. No pertinent surgical history.      Home Medications    Prior to Admission medications   Medication Sig Start Date End Date Taking? Authorizing Provider  diltiazem (CARDIZEM) 30 MG tablet Take 1 tablet (30 mg total) by mouth as needed (for palpitations/ fast heart rates). 06/24/13   Robbie LisSimmons, Brittainy M, PA-C  ibuprofen (ADVIL,MOTRIN) 600 MG tablet Take 1 tablet (600 mg total) by mouth every 6 (six) hours as needed. 06/04/15   Earley FavorSchulz, Gail, NP  sulfacetamide (BLEPH-10) 10 % ophthalmic solution Place 2 drops into the right eye every 4 (four) hours. 12/17/16   Lawyer, Cristal Deerhristopher, PA-C  sulfamethoxazole-trimethoprim (BACTRIM DS) 800-160 MG tablet Take 1 tablet by mouth 2 (two) times daily for 7 days. 08/10/18 08/17/18  Arlyn DunningMcLean, Alejandrina Raimer A, PA-C  traMADol (ULTRAM) 50 MG tablet Take 1 tablet (50 mg total) by mouth every 6 (six) hours as needed. 06/04/15   Earley FavorSchulz, Gail, NP    Family History No family history on file.  Social History Social History   Tobacco Use  . Smoking  status: Heavy Tobacco Smoker    Packs/day: 1.00    Years: 2.00    Pack years: 2.00    Types: Cigarettes  . Smokeless tobacco: Never Used  Substance Use Topics  . Alcohol use: No    Comment: occ  . Drug use: No     Allergies   Patient has no known allergies.   Review of Systems Review of Systems  Constitutional: Negative for chills and fever.  Gastrointestinal: Negative for nausea and vomiting.  Skin: Positive for color change and wound.  Allergic/Immunologic: Negative for immunocompromised state.  Hematological: Does not bruise/bleed easily.  All other systems reviewed and are negative.    Physical Exam Updated Vital Signs BP (!) 142/98 (BP Location: Right Arm)   Pulse 75   Temp 98.2 F (36.8 C) (Oral)   Resp 20   SpO2 98%   Physical Exam Vitals signs and nursing note reviewed.  Constitutional:      Appearance: Normal appearance.  HENT:     Head: Normocephalic.  Eyes:     Conjunctiva/sclera: Conjunctivae normal.  Pulmonary:     Effort: Pulmonary effort is normal.  Skin:    General: Skin is dry.     Comments: On the dorsum of the first phalanx of the left little finger there is a 3 mm abscess with surrounding erythema normal capillary refill there is mild swelling  of the finger on the dorsal portion but it is not circumferential swelling or erythema  Neurological:     Mental Status: He is alert.  Psychiatric:        Mood and Affect: Mood normal.      ED Treatments / Results  Labs (all labs ordered are listed, but only abnormal results are displayed) Labs Reviewed - No data to display  EKG None  Radiology No results found.  Procedures .Marland KitchenIncision and Drainage Date/Time: 08/10/2018 9:43 AM Performed by: Arlyn Dunning, PA-C Authorized by: Arlyn Dunning, PA-C   Consent:    Consent obtained:  Verbal   Consent given by:  Patient   Risks discussed:  Bleeding, incomplete drainage, pain and damage to other organs   Alternatives discussed:  No  treatment Universal protocol:    Procedure explained and questions answered to patient or proxy's satisfaction: yes     Relevant documents present and verified: yes     Test results available and properly labeled: yes     Imaging studies available: yes     Required blood products, implants, devices, and special equipment available: yes     Site/side marked: yes     Immediately prior to procedure a time out was called: yes     Patient identity confirmed:  Verbally with patient Location:    Type:  Abscess Pre-procedure details:    Skin preparation:  Betadine Anesthesia (see MAR for exact dosages):    Anesthesia method:  Local infiltration (Digital block)   Local anesthetic:  Lidocaine 1% w/o epi Procedure type:    Complexity:  Simple Procedure details:    Incision types:  Single straight   Incision depth:  Subcutaneous   Scalpel blade:  11   Wound management:  Probed and deloculated, irrigated with saline and extensive cleaning   Drainage:  Purulent   Drainage amount:  Moderate   Packing materials:  None Post-procedure details:    Patient tolerance of procedure:  Tolerated well, no immediate complications   (including critical care time)  Medications Ordered in ED Medications  lidocaine (PF) (XYLOCAINE) 1 % injection 5 mL (5 mLs Infiltration Given 08/10/18 0919)  mupirocin cream (BACTROBAN) 2 % ( Topical Given 08/10/18 6948)     Initial Impression / Assessment and Plan / ED Course  I have reviewed the triage vital signs and the nursing notes.  Pertinent labs & imaging results that were available during my care of the patient were reviewed by me and considered in my medical decision making (see chart for details).          Final Clinical Impressions(s) / ED Diagnoses   Final diagnoses:  Abscess of finger of left hand    ED Discharge Orders         Ordered    sulfamethoxazole-trimethoprim (BACTRIM DS) 800-160 MG tablet  2 times daily     08/10/18 0940            Arlyn Dunning, PA-C 08/10/18 0944    Terrilee Files, MD 08/10/18 [phone]

## 2018-08-10 NOTE — Discharge Instructions (Signed)
Thank you for allowing me to care for you today. Please return to the emergency department if you have new or worsening symptoms. Take your medications as instructed.  ° °

## 2018-08-10 NOTE — ED Triage Notes (Signed)
To ED for eval of left small finger - swelling and redness. Possibly bit by bug. Little drainage.

## 2019-06-24 ENCOUNTER — Encounter (HOSPITAL_COMMUNITY): Payer: Self-pay

## 2019-06-24 ENCOUNTER — Emergency Department (HOSPITAL_COMMUNITY)
Admission: EM | Admit: 2019-06-24 | Discharge: 2019-06-24 | Disposition: A | Discharge status: Home / Self Care | Payer: Managed Care, Other (non HMO) | Attending: Emergency Medicine | Admitting: Emergency Medicine

## 2019-06-24 ENCOUNTER — Other Ambulatory Visit: Payer: Self-pay

## 2019-06-24 DIAGNOSIS — K0889 Other specified disorders of teeth and supporting structures: Secondary | ICD-10-CM

## 2019-06-24 DIAGNOSIS — K0381 Cracked tooth: Secondary | ICD-10-CM | POA: Insufficient documentation

## 2019-06-24 DIAGNOSIS — K029 Dental caries, unspecified: Secondary | ICD-10-CM | POA: Insufficient documentation

## 2019-06-24 DIAGNOSIS — F17210 Nicotine dependence, cigarettes, uncomplicated: Secondary | ICD-10-CM | POA: Diagnosis not present

## 2019-06-24 DIAGNOSIS — Z79899 Other long term (current) drug therapy: Secondary | ICD-10-CM | POA: Insufficient documentation

## 2019-06-24 MED ORDER — PENICILLIN V POTASSIUM 500 MG PO TABS: 500.0000 mg | ORAL_TABLET | Freq: Four times a day (QID) | ORAL | 0 refills | Status: AC

## 2019-06-24 MED ORDER — CHLORHEXIDINE GLUCONATE 0.12 % MT SOLN: 15.0000 mL | Freq: Two times a day (BID) | OROMUCOSAL | 0 refills | Status: DC

## 2019-06-24 MED ORDER — NAPROXEN 500 MG PO TABS: 500.0000 mg | ORAL_TABLET | Freq: Two times a day (BID) | ORAL | 0 refills | Status: AC

## 2019-06-24 MED ORDER — PENICILLIN V POTASSIUM 500 MG PO TABS
500.0000 mg | ORAL_TABLET | Freq: Once | ORAL | Status: AC
  Administered 2019-06-24: 500 mg via ORAL
  Filled 2019-06-24: qty 1

## 2019-06-24 MED ORDER — NAPROXEN 500 MG PO TABS
500.0000 mg | ORAL_TABLET | Freq: Once | ORAL | Status: AC
  Administered 2019-06-24: 500 mg via ORAL
  Filled 2019-06-24: qty 1

## 2019-06-24 NOTE — ED Triage Notes (Signed)
Patient c/o left upper and lower dental pain since March.

## 2019-06-24 NOTE — ED Provider Notes (Signed)
[facility]-EMERGENCY DEPT Provider Note   CSN: [id number] Arrival date & time: 06/24/19  9675     History Chief Complaint  Patient presents with  . Dental Pain    Ricardo Boyer is a 29 y.o. male with past medical history significant for A. fib presents to emergency department today with chief complaint of dental pain x1 month. Patient states he saw a dentist in March and was advised to have a tooth pulled. The closest appointment he could get was for early May. He is describing intermittent aching pain in his left lower molar. Pain does not radiate. He rates pain 8/10 in severity. He has tried taking tylenol and using Orajel with minimal symptom relief. He states the tooth chipped years ago when eating a burger. Since that time he will have intermittent pain. Denies fever, chills, voice change, inability to control secretions, nausea/vomiting, facial swelling, dysphagia, odynophagia, drainage or trauma   History provided by patient with additional history obtained from chart review.    Past Medical History:  Diagnosis Date  . Dysrhythmia    atrial fibrilation  . Gonorrhea     Patient Active Problem List   Diagnosis Date Noted  . Abnormal CXR 07/11/2013  . Tobacco abuse 07/11/2013  . Atrial fibrillation (HCC) 06/23/2013    History reviewed. No pertinent surgical history.     Family History  Problem Relation Age of Onset  . Diabetes Mother     Social History   Tobacco Use  . Smoking status: Heavy Tobacco Smoker    Packs/day: 1.00    Years: 2.00    Pack years: 2.00    Types: Cigarettes  . Smokeless tobacco: Never Used  Substance Use Topics  . Alcohol use: Yes  . Drug use: No    Home Medications Prior to Admission medications   Medication Sig Start Date End Date Taking? Authorizing Provider  chlorhexidine (PERIDEX) 0.12 % solution Use as directed 15 mLs in the mouth or throat 2 (two) times daily. 06/24/19   Emiliano Welshans E, PA-C    diltiazem (CARDIZEM) 30 MG tablet Take 1 tablet (30 mg total) by mouth as needed (for palpitations/ fast heart rates). 06/24/13   Robbie Lis M, PA-C  ibuprofen (ADVIL,MOTRIN) 600 MG tablet Take 1 tablet (600 mg total) by mouth every 6 (six) hours as needed. 06/04/15   Earley Favor, NP  naproxen (NAPROSYN) 500 MG tablet Take 1 tablet (500 mg total) by mouth 2 (two) times daily for 7 days. 06/24/19 07/01/19  Atiba Kimberlin E, PA-C  penicillin v potassium (VEETID) 500 MG tablet Take 1 tablet (500 mg total) by mouth 4 (four) times daily for 7 days. 06/24/19 07/01/19  Zahrah Sutherlin E, PA-C  sulfacetamide (BLEPH-10) 10 % ophthalmic solution Place 2 drops into the right eye every 4 (four) hours. 12/17/16   Lawyer, Cristal Deer, PA-C  traMADol (ULTRAM) 50 MG tablet Take 1 tablet (50 mg total) by mouth every 6 (six) hours as needed. 06/04/15   Earley Favor, NP    Allergies    Patient has no known allergies.  Review of Systems   Review of Systems All other systems are reviewed and are negative for acute change except as noted in the HPI.  Physical Exam Updated Vital Signs BP (!) 171/110 (BP Location: Left Arm)   Pulse 65   Temp 98.1 F (36.7 C) (Oral)   Resp 18   Ht 5\' 9"  (1.753 m)   Wt 111.1 kg   SpO2 95%  BMI 36.18 kg/m   Physical Exam Vitals and nursing note reviewed.  Constitutional:      General: He is not in acute distress.    Appearance: He is well-developed. He is not toxic-appearing.  HENT:     Head: Normocephalic and atraumatic.     Nose: Nose normal.     Mouth/Throat:      Comments: Dentition appears to be stable. No noted area of swelling or fluctuance. No trismus. Mouth opening to at least 3 finger widths. Handles oral secretions without difficulty. External exam shows no asymmetry of the jaw line or face, no signs of obvious swelling or infection. No swelling or tenderness to the submental or submandibular regions. No swelling or tenderness into the soft  tissues of the neck.Full active range of motion of the jaw. Neck is supple with full active range of motion, no tenderness to palpation of the soft tissues.  Eyes:     General: No scleral icterus.       Right eye: No discharge.        Left eye: No discharge.     Conjunctiva/sclera: Conjunctivae normal.  Neck:     Vascular: No JVD.  Cardiovascular:     Rate and Rhythm: Normal rate and regular rhythm.     Pulses: Normal pulses.     Heart sounds: Normal heart sounds.  Pulmonary:     Effort: Pulmonary effort is normal.     Breath sounds: Normal breath sounds.  Abdominal:     General: There is no distension.  Musculoskeletal:        General: Normal range of motion.     Cervical back: Normal range of motion. No muscular tenderness.  Skin:    General: Skin is warm and dry.  Neurological:     Mental Status: He is oriented to person, place, and time.     GCS: GCS eye subscore is 4. GCS verbal subscore is 5. GCS motor subscore is 6.     Comments: Fluent speech, no facial droop.  Psychiatric:        Behavior: Behavior normal.      ED Results / Procedures / Treatments   Labs (all labs ordered are listed, but only abnormal results are displayed) Labs Reviewed - No data to display  EKG None  Radiology No results found.  Procedures Procedures (including critical care time)  Medications Ordered in ED Medications  naproxen (NAPROSYN) tablet 500 mg (has no administration in time range)  penicillin v potassium (VEETID) tablet 500 mg (has no administration in time range)    ED Course  I have reviewed the triage vital signs and the nursing notes.  Pertinent labs & imaging results that were available during my care of the patient were reviewed by me and considered in my medical decision making (see chart for details).    MDM Rules/Calculators/A&P                      Pt is well appearing and is presenting with dental pain. Differential Diagnosis includes but is not limited to:  toothache, abscess, periapical abscess, dental caries, cracked tooth, maxillary sinusitis, gingivitis, gum hyperplasia, tooth fracture, tooth dislocation. On exam patient has chronic dental decay. Patient with toothache.  No gross abscess.  Exam unconcerning for Ludwig's angina or spread of infection.  Will treat with penicillin and anti-inflammatories medicine.  Urged patient to follow-up with dentist and given dental resource list. VSS.  Pt appears stable for d/c. Strict  return precautions discussed.   Portions of this note were generated with [employer]. Dictation errors may occur despite best attempts at proofreading.  Final Clinical Impression(s) / ED Diagnoses Final diagnoses:  Pain, dental    Rx / DC Orders ED Discharge Orders         Ordered    chlorhexidine (PERIDEX) 0.12 % solution  2 times daily     06/24/19 1029    penicillin v potassium (VEETID) 500 MG tablet  4 times daily     06/24/19 1029    naproxen (NAPROSYN) 500 MG tablet  2 times daily     06/24/19 1029           Sherronda Sweigert, [place] 06/24/19 1206    Virgel Manifold, MD 06/25/19 1129

## 2019-06-24 NOTE — Discharge Instructions (Addendum)
Today you were seen for dental pain.  If you start to experience and new or worsening symptoms return to the emergency department. If you start to experience fever, chills, neck stiffness/pain, or inability to move your neck or open your mouth come back to the emergency department immediately. If you begin to experience any blistering, rashes, swelling, or difficulty breathing seek medical care for evaluation of potentially more serious side effects.  Medications:  -I have prescribed you an antibiotic penicillin to treat the infection and Naproxen which is an anti-inflammatory medicine to treat the pain.  -Swish and spit 15 mL of chlorhexidine mouthwash for 30 seconds times daily.  You can also gargle warm salt water up to 5 times daily.  Both of these rinses can help to remove bacteria from the mouth.  You can also use viscous lidocaine every 3 hours to help numb the area.  You can apply a cool compress for 15 to 20 minutes, but avoid applying heat to the area. -Continue usual home medications.  2. Follow Up: Follow-up with the dentist on call Dr. Andrey Campanile.  We have given you his contact information.  If you are unable to schedule appointment there are also resources below:   Use the resource guide listed below to help you find a dentist if you do not already have one to follow up with. It is very important that you get evaluated by a dentist as soon as possible. Call tomorrow to schedule an appointment. Read the instructions below.     Be sure to eat something when taking the Naproxen as it can cause stomach upset and at worst stomach bleeding. Do not take additional non steroidal anti-inflammatory medicines such as Ibuprofen, Aleve, Advil, Mobic, Diclofenac, or goodie powder while taking Naproxen. You may supplement with Tylenol.    Dental Care: Organization         Address  Phone  Notes  [facility] Department of [employer] [facility] [address] [place], [place] [phone] Accepts children up to age 37 who are enrolled in [place] or [place] Health Choice; pregnant women with a Medicaid card; and children who have applied for Medicaid or [place] Health Choice, but were declined, whose parents can pay a reduced fee at time of service.  [facility] Department of [facility]  [address], [place] [phone] Accepts children up to age 2 who are enrolled in [place] or [place] Health Choice; pregnant women with a Medicaid card; and children who have applied for Medicaid or [place] Health Choice, but were declined, whose parents can pay a reduced fee at time of service.  [facility]  [address] [place], [place] [phone] Patients are seen by appointment only. Walk-ins are not accepted. [facility] will see patients 41 years of age and older. Monday - Tuesday (8am-5pm) Most Wednesdays (8:30-5pm) $30 per visit, cash only  [facility]  [address], [facility] [phone] Patients are seen by appointment only. Walk-ins are not accepted. [facility] will see patients 7 years of age and older. One Wednesday Evening (Monthly: Volunteer Based).  $30 per visit, cash only  [employer]  [phone] for adults; Children under age 13, call Graduate Pediatric Dentistry at [phone]. Children aged 12-14, please call [phone] to request a pediatric application.  Dental services are provided in all areas of dental care including fillings, crowns and bridges,  complete and partial dentures, implants, gum treatment, root canals, and extractions. Preventive care is also provided. Treatment is provided to both adults and children. Patients are selected via a lottery and there is often a waiting list.   [facility] [address], [place]  [phone] [email or web address]   [facility] [address] [place], [place] [phone], Ext.  123 Second and Fourth Thursday of each month, opens at 6:30 AM; Clinic ends at 9 AM.  Patients are seen on a first-come first-served basis, and a limited number are seen during each clinic.   [facility]  [address] [place], [place] [phone]   Eligibility Requirements You must have lived in [place], [place], or [place] counties for at least the last three months.   You cannot be eligible for state or federal sponsored [employer], including [employer], [place], or [employer].   You generally cannot be eligible for healthcare insurance through your employer.    How to apply: Eligibility screenings are held every Tuesday and Wednesday afternoon from 1:00 pm until 4:00 pm. You do not need an appointment for the interview!  [facility] [address] [place], [place]   [place]  [place] Department  [place] Department  [phone]    RESOURCE GUIDE:  Dental Problems  Patients with Medicaid: [place] [phone] [address] Phone:  [phone]                                                  Phone:  [phone]  If unable to pay or uninsured, contact:  [facility] or [facility]. to become qualified for the adult dental clinic.  Insufficient Money for Medicine Contact United Way:  call "211" or [place] [phone].  No Primary Care Doctor Call Welcome Other agencies that provide inexpensive medical care    [place]  [phone]    [facility]  [place]  [phone]    [facility]  [phone]    [facility]  [phone]    [facility]  [phone]

## 2019-12-08 ENCOUNTER — Other Ambulatory Visit: Payer: Self-pay

## 2019-12-08 ENCOUNTER — Ambulatory Visit (HOSPITAL_COMMUNITY)
Admission: EM | Admit: 2019-12-08 | Discharge: 2019-12-08 | Disposition: A | Discharge status: Home / Self Care | Payer: Managed Care, Other (non HMO) | Attending: Family Medicine | Admitting: Family Medicine

## 2019-12-08 ENCOUNTER — Encounter (HOSPITAL_COMMUNITY): Payer: Self-pay | Admitting: *Deleted

## 2019-12-08 DIAGNOSIS — R0602 Shortness of breath: Secondary | ICD-10-CM | POA: Insufficient documentation

## 2019-12-08 DIAGNOSIS — Z20822 Contact with and (suspected) exposure to covid-19: Secondary | ICD-10-CM | POA: Insufficient documentation

## 2019-12-08 LAB — SARS CORONAVIRUS 2 (TAT 6-24 HRS): SARS Coronavirus 2: POSITIVE — AB

## 2019-12-08 MED ORDER — BENZONATATE 200 MG PO CAPS: 200.0000 mg | ORAL_CAPSULE | Freq: Two times a day (BID) | ORAL | 0 refills | Status: DC | PRN

## 2019-12-08 NOTE — Discharge Instructions (Addendum)
Rest Lots of water Stay home until you get COVID confirmation Take tessalon for the cough

## 2019-12-08 NOTE — ED Provider Notes (Signed)
[facility]    CSN: [id number] Arrival date & time: 12/08/19  9983      History   Chief Complaint Chief Complaint  Patient presents with  . Shortness of Breath  . Headache  . Loss of taste and smell    HPI Ricardo Boyer is a 29 y.o. male.   HPI  Patient is here for an upper respiratory infection.  He states he has body aches headache shortness of breath.  Minimal coughing.  No sore throat.  He does have loss of taste and smell.  He states it comes and goes.  He states that the symptoms as well as bothering him the most.  Shortness of breath increases when he eats or ambulate.  He states he is taking some ibuprofen at home.  Denies any sweats chills or fever.  Denies exposure to Covid.  Has not had Covid vaccinations  Past Medical History:  Diagnosis Date  . Dysrhythmia    atrial fibrilation  . Gonorrhea     Patient Active Problem List   Diagnosis Date Noted  . Abnormal CXR 07/11/2013  . Tobacco abuse 07/11/2013  . Atrial fibrillation (HCC) 06/23/2013    History reviewed. No pertinent surgical history.     Home Medications    Prior to Admission medications   Medication Sig Start Date End Date Taking? Authorizing Provider  ibuprofen (ADVIL,MOTRIN) 600 MG tablet Take 1 tablet (600 mg total) by mouth every 6 (six) hours as needed. 06/04/15  Yes Earley Favor, NP  benzonatate (TESSALON) 200 MG capsule Take 1 capsule (200 mg total) by mouth 2 (two) times daily as needed for cough. 12/08/19   Eustace Moore, MD  diltiazem (CARDIZEM) 30 MG tablet Take 1 tablet (30 mg total) by mouth as needed (for palpitations/ fast heart rates). 06/24/13 12/08/19  Allayne Butcher, PA-C    Family History Family History  Problem Relation Age of Onset  . Diabetes Mother     Social History Social History   Tobacco Use  . Smoking status: Heavy Tobacco Smoker    Packs/day: 1.00    Years: 2.00    Pack years: 2.00    Types: Cigarettes  . Smokeless tobacco: Never  Used  Vaping Use  . Vaping Use: Never used  Substance Use Topics  . Alcohol use: Yes  . Drug use: No     Allergies   Patient has no known allergies.   Review of Systems Review of Systems See HPI  Physical Exam Triage Vital Signs ED Triage Vitals  Enc Vitals Group     BP 12/08/19 1151 125/81     Pulse Rate 12/08/19 1151 85     Resp 12/08/19 1151 16     Temp 12/08/19 1151 98.4 F (36.9 C)     Temp Source 12/08/19 1151 Oral     SpO2 12/08/19 1151 100 %     Weight --      Height --      Head Circumference --      Peak Flow --      Pain Score 12/08/19 1153 0     Pain Loc --      Pain Edu? --      Excl. in GC? --    No data found.  Updated Vital Signs BP 125/81 (BP Location: Right Arm)   Pulse 85   Temp 98.4 F (36.9 C) (Oral)   Resp 16   SpO2 100%      Physical Exam Constitutional:  General: He is not in acute distress.    Appearance: He is well-developed.     Comments: Appears tired  HENT:     Head: Normocephalic and atraumatic.     Mouth/Throat:     Mouth: Mucous membranes are moist.     Pharynx: No posterior oropharyngeal erythema.  Eyes:     Conjunctiva/sclera: Conjunctivae normal.     Pupils: Pupils are equal, round, and reactive to light.  Cardiovascular:     Rate and Rhythm: Normal rate and regular rhythm.     Heart sounds: Normal heart sounds.  Pulmonary:     Effort: Pulmonary effort is normal. No respiratory distress.     Breath sounds: Normal breath sounds. No wheezing or rales.     Comments: Lungs are clear Abdominal:     General: There is no distension.     Palpations: Abdomen is soft.  Musculoskeletal:        General: Normal range of motion.     Cervical back: Normal range of motion.  Lymphadenopathy:     Cervical: No cervical adenopathy.  Skin:    General: Skin is warm and dry.  Neurological:     Mental Status: He is alert.  Psychiatric:        Mood and Affect: Mood normal.        Behavior: Behavior normal.      UC  Treatments / Results  Labs (all labs ordered are listed, but only abnormal results are displayed) Labs Reviewed  SARS CORONAVIRUS 2 (TAT 6-24 HRS)    EKG   Radiology No results found.  Procedures Procedures (including critical care time)  Medications Ordered in UC Medications - No data to display  Initial Impression / Assessment and Plan / UC Course  I have reviewed the triage vital signs and the nursing notes.  Pertinent labs & imaging results that were available during my care of the patient were reviewed by me and considered in my medical decision making (see chart for details).     Upper respiratory infection.  Possible Covid.  Discussed importance of quarantine. Final Clinical Impressions(s) / UC Diagnoses   Final diagnoses:  Suspected COVID-19 virus infection  Shortness of breath     Discharge Instructions     Rest Lots of water Stay home until you get COVID confirmation Take tessalon for the cough    ED Prescriptions    Medication Sig Dispense Auth. Provider   benzonatate (TESSALON) 200 MG capsule Take 1 capsule (200 mg total) by mouth 2 (two) times daily as needed for cough. 20 capsule Eustace Moore, MD     PDMP not reviewed this encounter.   Eustace Moore, MD 12/08/19 [phone]

## 2019-12-08 NOTE — ED Triage Notes (Addendum)
Patient in with complaints of headache, body aches, SOB and loss of taste and smell x 3 days. SOB increases with lying down and eating. Patient denies any fever. Patient has taken Ibuprofen at home.

## 2019-12-09 ENCOUNTER — Telehealth: Payer: Self-pay | Admitting: Family

## 2019-12-09 NOTE — Telephone Encounter (Signed)
Called to Discuss with patient about Covid symptoms and the use of the monoclonal antibody infusion for those with mild to moderate Covid symptoms and at a high risk of hospitalization.     Pt appears to qualify for this infusion due to co-morbid conditions and/or a member of an at-risk group in accordance with the FDA Emergency Use Authorization.   Ricardo Boyer was seen at the [facility] urgent care center on 12/08/2019 with the chief complaints of shortness of breath, body aches, loss of taste/smell, headache, and minimal coughing.  Coughing risk factors include BMI greater than 25 and increased social risk.  Attempted to speak with Ricardo Boyer regarding his interest in receiving monoclonal antibody therapy with [facility] and was unable to reach him via phone.  His mailbox was full.  Will send my chart message and text with additional information.  Marcos Eke, NP 12/09/2019 11:32 AM

## 2020-06-11 ENCOUNTER — Other Ambulatory Visit: Payer: Self-pay

## 2020-06-11 ENCOUNTER — Encounter (HOSPITAL_COMMUNITY): Payer: Self-pay

## 2020-06-11 ENCOUNTER — Emergency Department (HOSPITAL_COMMUNITY)
Admission: EM | Admit: 2020-06-11 | Discharge: 2020-06-11 | Disposition: A | Discharge status: Home / Self Care | Payer: Managed Care, Other (non HMO) | Attending: Emergency Medicine | Admitting: Emergency Medicine

## 2020-06-11 DIAGNOSIS — F17210 Nicotine dependence, cigarettes, uncomplicated: Secondary | ICD-10-CM | POA: Diagnosis not present

## 2020-06-11 DIAGNOSIS — Z711 Person with feared health complaint in whom no diagnosis is made: Secondary | ICD-10-CM | POA: Diagnosis not present

## 2020-06-11 DIAGNOSIS — R300 Dysuria: Secondary | ICD-10-CM | POA: Insufficient documentation

## 2020-06-11 LAB — RPR: RPR Ser Ql: NONREACTIVE

## 2020-06-11 LAB — RAPID HIV SCREEN (HIV 1/2 AB+AG)
HIV 1/2 Antibodies: NONREACTIVE
HIV-1 P24 Antigen - HIV24: NONREACTIVE

## 2020-06-11 MED ORDER — DOXYCYCLINE HYCLATE 100 MG PO CAPS: 100.0000 mg | ORAL_CAPSULE | Freq: Two times a day (BID) | ORAL | 0 refills | Status: AC

## 2020-06-11 MED ORDER — METRONIDAZOLE 500 MG PO TABS
2000.0000 mg | ORAL_TABLET | Freq: Once | ORAL | Status: AC
  Administered 2020-06-11: 2000 mg via ORAL
  Filled 2020-06-11: qty 4

## 2020-06-11 MED ORDER — LIDOCAINE HCL (PF) 1 % IJ SOLN
INTRAMUSCULAR | Status: AC
  Administered 2020-06-11: 1 mL
  Filled 2020-06-11: qty 5

## 2020-06-11 MED ORDER — CEFTRIAXONE SODIUM 500 MG IJ SOLR
500.0000 mg | Freq: Once | INTRAMUSCULAR | Status: AC
  Administered 2020-06-11: 500 mg via INTRAMUSCULAR
  Filled 2020-06-11: qty 500

## 2020-06-11 NOTE — ED Provider Notes (Signed)
[facility] EMERGENCY DEPARTMENT Provider Note   CSN: [id number] Arrival date & time: 06/11/20  9629     History Chief Complaint  Patient presents with  . Exposure to STD    Ricardo Boyer is a 30 y.o. male who presents to ED with concern for STD.  States that he had unprotected sexual intercourse with a partner about 3 weeks ago.  He was told recently that they tested positive for trichomonas.  Patient has been having dysuria for the past few days with similar symptoms in the past related to STD.  He denies any penile discharge, testicular pain or swelling, rashes or lesions, abdominal pain, fever.  HPI     Past Medical History:  Diagnosis Date  . Dysrhythmia    atrial fibrilation  . Gonorrhea     Patient Active Problem List   Diagnosis Date Noted  . Abnormal CXR 07/11/2013  . Tobacco abuse 07/11/2013  . Atrial fibrillation (HCC) 06/23/2013    History reviewed. No pertinent surgical history.     Family History  Problem Relation Age of Onset  . Diabetes Mother     Social History   Tobacco Use  . Smoking status: Heavy Tobacco Smoker    Packs/day: 1.00    Years: 2.00    Pack years: 2.00    Types: Cigarettes  . Smokeless tobacco: Never Used  Vaping Use  . Vaping Use: Never used  Substance Use Topics  . Alcohol use: Yes  . Drug use: No    Home Medications Prior to Admission medications   Medication Sig Start Date End Date Taking? Authorizing Provider  cetirizine (ZYRTEC) 10 MG tablet Take 10 mg by mouth daily as needed for allergies.   Yes [provider]  doxycycline (VIBRAMYCIN) 100 MG capsule Take 1 capsule (100 mg total) by mouth 2 (two) times daily for 7 days. 06/11/20 06/18/20 Yes Kendy Haston, PA-C  diltiazem (CARDIZEM) 30 MG tablet Take 1 tablet (30 mg total) by mouth as needed (for palpitations/ fast heart rates). 06/24/13 12/08/19  Allayne Butcher, PA-C    Allergies    Patient has no known allergies.  Review of  Systems   Review of Systems  Constitutional: Negative for chills and fever.  Gastrointestinal: Negative for abdominal pain.  Genitourinary: Positive for dysuria. Negative for penile discharge, penile swelling and testicular pain.  Skin: Negative for rash.    Physical Exam Updated Vital Signs BP (!) 150/71 (BP Location: Right Arm)   Pulse 65   Temp 98.3 F (36.8 C) (Oral)   Resp 18   Ht 5\' 9"  (1.753 m)   Wt 108.4 kg   SpO2 97%   BMI 35.29 kg/m   Physical Exam Vitals and nursing note reviewed. Exam conducted with a chaperone present.  Constitutional:      General: He is not in acute distress.    Appearance: He is well-developed. He is not diaphoretic.  HENT:     Head: Normocephalic and atraumatic.  Eyes:     General: No scleral icterus.    Conjunctiva/sclera: Conjunctivae normal.  Pulmonary:     Effort: Pulmonary effort is normal. No respiratory distress.  Genitourinary:    Penis: Normal and circumcised.      Testes: Normal.        Right: Tenderness not present.        Left: Tenderness not present.     Comments: Normal male genitalia noted. Penis, scrotum, and testicles without swelling, lesions, rashes, or tenderness present.  No penile discharge noted. Cremasteric reflex intact. RN served as Biomedical engineer during the exam.  Musculoskeletal:     Cervical back: Normal range of motion.  Skin:    Findings: No rash.  Neurological:     Mental Status: He is alert.     ED Results / Procedures / Treatments   Labs (all labs ordered are listed, but only abnormal results are displayed) Labs Reviewed  RAPID HIV SCREEN (HIV 1/2 AB+AG)  RPR  GC/CHLAMYDIA PROBE AMP ([place]) NOT AT [facility]    EKG None  Radiology No results found.  Procedures Procedures   Medications Ordered in ED Medications  metroNIDAZOLE (FLAGYL) tablet 2,000 mg (has no administration in time range)  cefTRIAXone (ROCEPHIN) injection 500 mg (has no administration in time range)    ED Course  I  have reviewed the triage vital signs and the nursing notes.  Pertinent labs & imaging results that were available during my care of the patient were reviewed by me and considered in my medical decision making (see chart for details).    MDM Rules/Calculators/A&P                          30 year old male presenting to the ED for concern for STD.  States that he had unprotected sexual intercourse with a partner a few weeks ago and was told recently that they tested positive for trichomonas.  Reports having dysuria for the past few days.  He denies any penile discharge, abdominal pain, testicular pain or swelling or fever.  Denies any rashes or lesions.  Physical exam findings without any abnormalities.  Vital signs within normal limits.  Will treat with Flagyl for trichomonas.  Patient would like to also be tested and treated for gonorrhea chlamydia as well as tested for HIV and syphilis.  Informed him to follow-up with results of this when they are available and that he will be contacted if anything is positive.  Will treat with Rocephin and doxycycline.  Return precautions given.   Patient is hemodynamically stable, in NAD, and able to ambulate in the ED. Evaluation does not show pathology that would require ongoing emergent intervention or inpatient treatment. I explained the diagnosis to the patient. Pain has been managed and has no complaints prior to discharge. Patient is comfortable with above plan and is stable for discharge at this time. All questions were answered prior to disposition. Strict return precautions for returning to the ED were discussed. Encouraged follow up with PCP.   An After Visit Summary was printed and given to the patient.   Portions of this note were generated with Scientist, clinical (histocompatibility and immunogenetics). Dictation errors may occur despite best attempts at proofreading.  Final Clinical Impression(s) / ED Diagnoses Final diagnoses:  Concern about STD in male without diagnosis     Rx / DC Orders ED Discharge Orders         Ordered    doxycycline (VIBRAMYCIN) 100 MG capsule  2 times daily        06/11/20 0733           Dietrich Pates, PA-C 06/11/20 0743    Horton, Clabe Seal, DO 06/12/20 [phone]

## 2020-06-11 NOTE — ED Triage Notes (Addendum)
Pt reports he think he might have an STD. Pt reports he unprotective sex with someone a couple of days ago.Pt reports painful urination. Pt reports ' I need to get tested".

## 2020-06-11 NOTE — Discharge Instructions (Signed)
We will contact you with the results of your testing when it is available. Take the doxycycline for 1 week. Return to the ER if you start to experience pain, swelling, rashes, fever.

## 2020-06-12 LAB — GC/CHLAMYDIA PROBE AMP (~~LOC~~) NOT AT ARMC
Chlamydia: NEGATIVE
Comment: NEGATIVE
Comment: NORMAL
Neisseria Gonorrhea: NEGATIVE

## 2021-01-08 ENCOUNTER — Emergency Department (HOSPITAL_COMMUNITY): Payer: Managed Care, Other (non HMO)

## 2021-01-08 ENCOUNTER — Other Ambulatory Visit: Payer: Self-pay

## 2021-01-08 ENCOUNTER — Emergency Department (HOSPITAL_COMMUNITY)
Admission: EM | Admit: 2021-01-08 | Discharge: 2021-01-09 | Disposition: A | Discharge status: Home / Self Care | Payer: Managed Care, Other (non HMO) | Attending: Emergency Medicine | Admitting: Emergency Medicine

## 2021-01-08 ENCOUNTER — Encounter (HOSPITAL_COMMUNITY): Payer: Self-pay | Admitting: *Deleted

## 2021-01-08 DIAGNOSIS — J181 Lobar pneumonia, unspecified organism: Secondary | ICD-10-CM | POA: Diagnosis not present

## 2021-01-08 DIAGNOSIS — R509 Fever, unspecified: Secondary | ICD-10-CM | POA: Diagnosis present

## 2021-01-08 DIAGNOSIS — Z20822 Contact with and (suspected) exposure to covid-19: Secondary | ICD-10-CM | POA: Diagnosis not present

## 2021-01-08 DIAGNOSIS — F17210 Nicotine dependence, cigarettes, uncomplicated: Secondary | ICD-10-CM | POA: Insufficient documentation

## 2021-01-08 DIAGNOSIS — R000 Tachycardia, unspecified: Secondary | ICD-10-CM | POA: Diagnosis not present

## 2021-01-08 DIAGNOSIS — J189 Pneumonia, unspecified organism: Secondary | ICD-10-CM

## 2021-01-08 LAB — RESP PANEL BY RT-PCR (FLU A&B, COVID) ARPGX2
Influenza A by PCR: NEGATIVE
Influenza B by PCR: NEGATIVE
SARS Coronavirus 2 by RT PCR: NEGATIVE

## 2021-01-08 MED ORDER — ACETAMINOPHEN 500 MG PO TABS
1000.0000 mg | ORAL_TABLET | Freq: Once | ORAL | Status: AC
  Administered 2021-01-08: 1000 mg via ORAL
  Filled 2021-01-08: qty 2

## 2021-01-08 NOTE — ED Provider Notes (Signed)
Emergency Medicine Provider Triage Evaluation Note  Ricardo Boyer , a 30 y.o. male  was evaluated in triage.  Pt complains of body aches, cough, and feeling poorly since Monday.  Denies sick contacts.  No meds PTA.  Review of Systems  Positive: Cough, headache Negative: SOB, vomiting, diarrhea  Physical Exam  BP 126/87   Pulse (!) 110   Temp (!) 103.1 F (39.5 C) (Oral)   Resp 18   SpO2 92%  Gen:   Awake, no distress   Resp:  Normal effort  MSK:   Moves extremities without difficulty  Other:    Medical Decision Making  Medically screening exam initiated at 10:34 PM.  Appropriate orders placed.  Ricardo Boyer was informed that the remainder of the evaluation will be completed by another provider, this initial triage assessment does not replace that evaluation, and the importance of remaining in the ED until their evaluation is complete.  Febrile in triage but non-toxic in appearance.  Possibly flu.  Viral screen sent, will obtain CXR.   Ricardo Hatchet, PA-C 01/08/21 2236    Ricardo Sleeper, MD 01/09/21 1006

## 2021-01-08 NOTE — ED Triage Notes (Signed)
Pt states headache, cough, chest pain back pain since Monday.

## 2021-01-09 MED ORDER — AMOXICILLIN-POT CLAVULANATE 875-125 MG PO TABS
1.0000 | ORAL_TABLET | Freq: Once | ORAL | Status: AC
  Administered 2021-01-09: 1 via ORAL
  Filled 2021-01-09: qty 1

## 2021-01-09 MED ORDER — AMOXICILLIN-POT CLAVULANATE 875-125 MG PO TABS: 1.0000 | ORAL_TABLET | Freq: Two times a day (BID) | ORAL | 0 refills | Status: DC

## 2021-01-09 MED ORDER — BENZONATATE 100 MG PO CAPS: 100.0000 mg | ORAL_CAPSULE | Freq: Two times a day (BID) | ORAL | 0 refills | Status: DC | PRN

## 2021-01-09 NOTE — ED Provider Notes (Signed)
[facility] EMERGENCY DEPARTMENT Provider Note   CSN: [id number] Arrival date & time: 01/08/21  2006     History Chief Complaint  Patient presents with   URI    Nature Ricardo Boyer is a 30 y.o. male.  Patient presents to the emergency department with a chief complaint of fever, chills, generalized body aches, and cough.  He states that his symptoms started yesterday.  He denies any treatments prior to arrival.  He reports some right-sided chest pain.  Denies vomiting or diarrhea.  Denies any other associated symptoms.  The history is provided by the patient. No language interpreter was used.      Past Medical History:  Diagnosis Date   Dysrhythmia    atrial fibrilation   Gonorrhea     Patient Active Problem List   Diagnosis Date Noted   Abnormal CXR 07/11/2013   Tobacco abuse 07/11/2013   Atrial fibrillation (HCC) 06/23/2013    History reviewed. No pertinent surgical history.     Family History  Problem Relation Age of Onset   Diabetes Mother     Social History   Tobacco Use   Smoking status: Heavy Smoker    Packs/day: 0.50    Years: 2.00    Pack years: 1.00    Types: Cigarettes   Smokeless tobacco: Never  Vaping Use   Vaping Use: Never used  Substance Use Topics   Alcohol use: Never   Drug use: No    Home Medications Prior to Admission medications   Medication Sig Start Date End Date Taking? Authorizing Provider  cetirizine (ZYRTEC) 10 MG tablet Take 10 mg by mouth daily as needed for allergies.    [provider]  diltiazem (CARDIZEM) 30 MG tablet Take 1 tablet (30 mg total) by mouth as needed (for palpitations/ fast heart rates). 06/24/13 12/08/19  Allayne Butcher, PA-C    Allergies    Patient has no known allergies.  Review of Systems   Review of Systems  All other systems reviewed and are negative.  Physical Exam Updated Vital Signs BP 126/87   Pulse (!) 110   Temp (!) 103.1 F (39.5 C) (Oral)   Resp 18    SpO2 92%   Physical Exam Vitals and nursing note reviewed.  Constitutional:      Appearance: He is well-developed.  HENT:     Head: Normocephalic and atraumatic.  Eyes:     Conjunctiva/sclera: Conjunctivae normal.  Cardiovascular:     Rate and Rhythm: Regular rhythm. Tachycardia present.     Heart sounds: No murmur heard. Pulmonary:     Effort: Pulmonary effort is normal. No respiratory distress.     Breath sounds: Normal breath sounds.     Comments: Lung sounds are clear to auscultation bilaterally Abdominal:     Palpations: Abdomen is soft.     Tenderness: There is no abdominal tenderness.  Musculoskeletal:        General: Normal range of motion.     Cervical back: Neck supple.  Skin:    General: Skin is warm and dry.  Neurological:     Mental Status: He is alert and oriented to person, place, and time.  Psychiatric:        Mood and Affect: Mood normal.        Behavior: Behavior normal.    ED Results / Procedures / Treatments   Labs (all labs ordered are listed, but only abnormal results are displayed) Labs Reviewed  RESP PANEL BY RT-PCR (FLU  A&B, COVID) [id number]    EKG EKG Interpretation  Date/Time:  Wednesday January 08 2021 22:09:36 EDT Ventricular Rate:  120 PR Interval:  116 QRS Duration: 84 QT Interval:  304 QTC Calculation: 429 R Axis:   83 Text Interpretation: Sinus tachycardia Right atrial enlargement ST & T wave abnormality, consider inferolateral ischemia Abnormal ECG ST and T wave changes more pronounced than prior EKG Jul 16, 2022 Confirmed by Meridee Score [phone]) on 01/08/2021 10:27:40 PM  Radiology DG Chest 2 View  Result Date: 01/08/2021 CLINICAL DATA:  Cough and fever. EXAM: CHEST - 2 VIEW COMPARISON:  Chest radiograph 07/11/2013 FINDINGS: Consolidation in the perihilar right upper lobe. There may be minimal ill-defined vague opacity in the periphery of the left mid lower lung zone. The heart is normal in size. No pleural effusion or pneumothorax.  No acute osseous abnormalities are seen. IMPRESSION: Right upper lobe pneumonia. Possible minimal ill-defined opacity in the left mid lower lung zone may represent additional site of infection. Electronically Signed   By: Narda Rutherford M.D.   On: 01/08/2021 23:48    Procedures Procedures   Medications Ordered in ED Medications  amoxicillin-clavulanate (AUGMENTIN) 875-125 MG per tablet 1 tablet (has no administration in time range)  acetaminophen (TYLENOL) tablet 1,000 mg (1,000 mg Oral Given 01/08/21 15-Jul-2236)    ED Course  I have reviewed the triage vital signs and the nursing notes.  Pertinent labs & imaging results that were available during my care of the patient were reviewed by me and considered in my medical decision making (see chart for details).    MDM Rules/Calculators/A&P                           Patient here with cough and fever.  Chest x-ray notable for right-sided pneumonia, COVID and flu are negative.  Patient is noted to be febrile to 103.1 in triage with heart rate 110, oxygen is 92%.  Will ambulate patient with O2 monitor.  Patient does not have any other significant medical problems, I believe that he is low risk for outpatient treatment for community-acquired pneumonia.  Vitals:   01/08/21 Jul 15, 2204 01/09/21 0045  BP: 126/87   Pulse: (!) 110   Resp: 18   Temp: (!) 103.1 F (39.5 C) 100.1 F (37.8 C)  SpO2: 92%      Final Clinical Impression(s) / ED Diagnoses Final diagnoses:  Community acquired pneumonia of right upper lobe of lung    Rx / DC Orders ED Discharge Orders          Ordered    amoxicillin-clavulanate (AUGMENTIN) 875-125 MG tablet  Every 12 hours        01/09/21 0104    benzonatate (TESSALON) 100 MG capsule  2 times daily PRN        01/09/21 0104             Roxy Horseman, PA-C 01/09/21 0105    Palumbo, 2022-07-16, MD 01/09/21 5329

## 2021-01-09 NOTE — ED Notes (Signed)
Pt ambulated in hallway with spo2 95%-98%.

## 2021-01-10 ENCOUNTER — Encounter (HOSPITAL_COMMUNITY): Payer: Self-pay | Admitting: Emergency Medicine

## 2021-01-10 ENCOUNTER — Emergency Department (HOSPITAL_COMMUNITY)
Admission: EM | Admit: 2021-01-10 | Discharge: 2021-01-11 | Disposition: A | Discharge status: Home / Self Care | Payer: Managed Care, Other (non HMO) | Attending: Emergency Medicine | Admitting: Emergency Medicine

## 2021-01-10 ENCOUNTER — Emergency Department (HOSPITAL_COMMUNITY): Payer: Managed Care, Other (non HMO)

## 2021-01-10 ENCOUNTER — Other Ambulatory Visit: Payer: Self-pay

## 2021-01-10 DIAGNOSIS — R000 Tachycardia, unspecified: Secondary | ICD-10-CM | POA: Insufficient documentation

## 2021-01-10 DIAGNOSIS — F17210 Nicotine dependence, cigarettes, uncomplicated: Secondary | ICD-10-CM | POA: Insufficient documentation

## 2021-01-10 DIAGNOSIS — R911 Solitary pulmonary nodule: Secondary | ICD-10-CM | POA: Diagnosis not present

## 2021-01-10 DIAGNOSIS — J189 Pneumonia, unspecified organism: Secondary | ICD-10-CM

## 2021-01-10 DIAGNOSIS — J181 Lobar pneumonia, unspecified organism: Secondary | ICD-10-CM | POA: Insufficient documentation

## 2021-01-10 DIAGNOSIS — Z20822 Contact with and (suspected) exposure to covid-19: Secondary | ICD-10-CM | POA: Insufficient documentation

## 2021-01-10 DIAGNOSIS — R0789 Other chest pain: Secondary | ICD-10-CM | POA: Diagnosis present

## 2021-01-10 LAB — CBC WITH DIFFERENTIAL/PLATELET
Abs Immature Granulocytes: 0 10*3/uL (ref 0.00–0.07)
Basophils Absolute: 0.1 10*3/uL (ref 0.0–0.1)
Basophils Relative: 1 %
Eosinophils Absolute: 0 10*3/uL (ref 0.0–0.5)
Eosinophils Relative: 0 %
HCT: 50.4 % (ref 39.0–52.0)
Hemoglobin: 17.3 g/dL — ABNORMAL HIGH (ref 13.0–17.0)
Lymphocytes Relative: 14 %
Lymphs Abs: 2 10*3/uL (ref 0.7–4.0)
MCH: 31.3 pg (ref 26.0–34.0)
MCHC: 34.3 g/dL (ref 30.0–36.0)
MCV: 91.1 fL (ref 80.0–100.0)
Monocytes Absolute: 1.4 10*3/uL — ABNORMAL HIGH (ref 0.1–1.0)
Monocytes Relative: 10 %
Neutro Abs: 10.5 10*3/uL — ABNORMAL HIGH (ref 1.7–7.7)
Neutrophils Relative %: 75 %
Platelets: 186 10*3/uL (ref 150–400)
RBC: 5.53 MIL/uL (ref 4.22–5.81)
RDW: 13.2 % (ref 11.5–15.5)
WBC: 14 10*3/uL — ABNORMAL HIGH (ref 4.0–10.5)
nRBC: 0 % (ref 0.0–0.2)
nRBC: 0 /100 WBC

## 2021-01-10 LAB — COMPREHENSIVE METABOLIC PANEL
ALT: 39 U/L (ref 0–44)
AST: 32 U/L (ref 15–41)
Albumin: 3.4 g/dL — ABNORMAL LOW (ref 3.5–5.0)
Alkaline Phosphatase: 78 U/L (ref 38–126)
Anion gap: 10 (ref 5–15)
BUN: 13 mg/dL (ref 6–20)
CO2: 26 mmol/L (ref 22–32)
Calcium: 9.3 mg/dL (ref 8.9–10.3)
Chloride: 97 mmol/L — ABNORMAL LOW (ref 98–111)
Creatinine, Ser: 1.26 mg/dL — ABNORMAL HIGH (ref 0.61–1.24)
GFR, Estimated: >60 mL/min (ref >60)
Glucose, Bld: 142 mg/dL — ABNORMAL HIGH (ref 70–99)
Potassium: 3.9 mmol/L (ref 3.5–5.1)
Sodium: 133 mmol/L — ABNORMAL LOW (ref 135–145)
Total Bilirubin: 0.6 mg/dL (ref 0.3–1.2)
Total Protein: 7.7 g/dL (ref 6.5–8.1)

## 2021-01-10 LAB — RESP PANEL BY RT-PCR (FLU A&B, COVID) ARPGX2
Influenza A by PCR: NEGATIVE
Influenza B by PCR: NEGATIVE
SARS Coronavirus 2 by RT PCR: NEGATIVE

## 2021-01-10 MED ORDER — ONDANSETRON 4 MG PO TBDP
4.0000 mg | ORAL_TABLET | Freq: Once | ORAL | Status: AC
  Administered 2021-01-10: 4 mg via ORAL
  Filled 2021-01-10: qty 1

## 2021-01-10 NOTE — ED Triage Notes (Signed)
Pt arrived POV c/o n/v, generalized weakness since PNA dx. Pt reports he is unable to keep meds down d/t n/v. Pt appears weak. Mild pain L side.

## 2021-01-10 NOTE — ED Provider Notes (Signed)
Emergency Medicine Provider Triage Evaluation Note  Ricardo Boyer , a 30 y.o. male  was evaluated in triage.  Pt complains of cough, nausea, vomiting, fever, body aches.  Patient states symptoms began several days ago.  He was seen in the ER, diagnosed with pneumonia.  He was given an antibiotic, but states he has had immediate emesis after taking the antibiotic x3.  He reports persistent nausea, also having upper abdominal pain.  Chest pain has improved however.  Review of Systems  Positive: Fever, n/v, abd pain Negative: sob  Physical Exam  BP 136/89 (BP Location: Right Arm)   Pulse (!) 108   Temp (!) 101.9 F (38.8 C) (Oral)   Resp 16   SpO2 98%  Gen:   Awake, no distress   Resp:  Normal effort  MSK:   Moves extremities without difficulty  Other:  Diffuse ttp of the abd   Medical Decision Making  Medically screening exam initiated at 4:41 PM.  Appropriate orders placed.  Loranzo Desha was informed that the remainder of the evaluation will be completed by another provider, this initial triage assessment does not replace that evaluation, and the importance of remaining in the ED until their evaluation is complete.  Labs, cxr, resp panel   Alveria Apley, PA-C 01/10/21 1642    Sloan Leiter, DO 01/10/21 1955

## 2021-01-11 ENCOUNTER — Emergency Department (HOSPITAL_COMMUNITY): Payer: Managed Care, Other (non HMO)

## 2021-01-11 LAB — LACTIC ACID, PLASMA: Lactic Acid, Venous: 1.3 mmol/L (ref 0.5–1.9)

## 2021-01-11 LAB — TROPONIN I (HIGH SENSITIVITY)
Troponin I (High Sensitivity): 22 ng/L — ABNORMAL HIGH (ref <18)
Troponin I (High Sensitivity): 19 ng/L — ABNORMAL HIGH (ref <18)

## 2021-01-11 LAB — D-DIMER, QUANTITATIVE: D-Dimer, Quant: 2.54 ug/mL-FEU — ABNORMAL HIGH (ref 0.00–0.50)

## 2021-01-11 MED ORDER — SODIUM CHLORIDE 0.9 % IV SOLN
1.0000 g | Freq: Once | INTRAVENOUS | Status: AC
  Administered 2021-01-11: 1 g via INTRAVENOUS
  Filled 2021-01-11: qty 10

## 2021-01-11 MED ORDER — IOHEXOL 350 MG/ML SOLN
80.0000 mL | Freq: Once | INTRAVENOUS | Status: AC | PRN
  Administered 2021-01-11: 80 mL via INTRAVENOUS

## 2021-01-11 MED ORDER — AZITHROMYCIN 250 MG PO TABS: 500.0000 mg | ORAL_TABLET | Freq: Every day | ORAL | Status: DC

## 2021-01-11 MED ORDER — ONDANSETRON HCL 4 MG/2ML IJ SOLN
4.0000 mg | Freq: Once | INTRAMUSCULAR | Status: AC
  Administered 2021-01-11: 4 mg via INTRAVENOUS
  Filled 2021-01-11: qty 2

## 2021-01-11 MED ORDER — AZITHROMYCIN 250 MG PO TABS: 250.0000 mg | ORAL_TABLET | Freq: Every day | ORAL | 0 refills | Status: DC

## 2021-01-11 MED ORDER — ONDANSETRON 4 MG PO TBDP: 4.0000 mg | ORAL_TABLET | Freq: Three times a day (TID) | ORAL | 0 refills | Status: DC | PRN

## 2021-01-11 MED ORDER — ACETAMINOPHEN 325 MG PO TABS
650.0000 mg | ORAL_TABLET | Freq: Once | ORAL | Status: AC
  Administered 2021-01-11: 650 mg via ORAL
  Filled 2021-01-11: qty 2

## 2021-01-11 MED ORDER — SODIUM CHLORIDE 0.9 % IV BOLUS
1000.0000 mL | Freq: Once | INTRAVENOUS | Status: AC
  Administered 2021-01-11: 1000 mL via INTRAVENOUS

## 2021-01-11 NOTE — ED Notes (Signed)
Pt to CT scanner

## 2021-01-11 NOTE — Discharge Instructions (Addendum)
You had a CT scan of your chest performed today that showed pneumonia. There is a nodule on your CT scan as well. It is very important that this nodule gets followed up to make sure that it is nothing concerning such as cancer. Please call your family doctor or follow-up with community health and wellness for further evaluation. Return to the emergency department if you have worsening breathing or new concerning symptoms.

## 2021-01-11 NOTE — ED Provider Notes (Signed)
[facility] EMERGENCY DEPARTMENT Provider Note   CSN: [id number] Arrival date & time: 01/10/21  1450     History Chief Complaint  Patient presents with   Pneumonia    Ricardo Boyer is a 30 y.o. male.  The history is provided by the patient and medical records.  Pneumonia Ricardo Boyer is a 30 y.o. male who presents to the Emergency Department complaining of chest pain, vomiting.  He presents to the ED for evaluation of vomiting.  He started getting sick on Wednesday night with right sided chest pain, fever, malaise.  He was seen in the ED on Thursday and started on augmentin for pneumonia.  He has been taking the antibiotic but shortly after starting it he developed significant vomiting and has been unable to keep the medication down.  He has persistent fevers and right upper chest discomfort and discomfort with breathing.  Mild cough.  No leg swelling.  Has no known medical problems.       Past Medical History:  Diagnosis Date   Dysrhythmia    atrial fibrilation   Gonorrhea     Patient Active Problem List   Diagnosis Date Noted   Abnormal CXR 07/11/2013   Tobacco abuse 07/11/2013   Atrial fibrillation ([place]) 06/23/2013    History reviewed. No pertinent surgical history.     Family History  Problem Relation Age of Onset   Diabetes Mother     Social History   Tobacco Use   Smoking status: Heavy Smoker    Packs/day: 0.50    Years: 2.00    Pack years: 1.00    Types: Cigarettes   Smokeless tobacco: Never  Vaping Use   Vaping Use: Never used  Substance Use Topics   Alcohol use: Never   Drug use: No    Home Medications Prior to Admission medications   Medication Sig Start Date End Date Taking? Authorizing Provider  amoxicillin-clavulanate (AUGMENTIN) 875-125 MG tablet Take 1 tablet by mouth every 12 (twelve) hours. 01/09/21  Yes Montine Circle, PA-C  azithromycin (ZITHROMAX) 250 MG tablet Take 1 tablet (250 mg total) by mouth daily.  Take first 2 tablets together, then 1 every day until finished. 01/11/21  Yes Quintella Reichert, MD  benzonatate (TESSALON) 100 MG capsule Take 1 capsule (100 mg total) by mouth 2 (two) times daily as needed for cough. 01/09/21  Yes Montine Circle, PA-C  ibuprofen (ADVIL) 200 MG tablet Take 200 mg by mouth every 6 (six) hours as needed for headache or mild pain.   Yes [provider]  ondansetron (ZOFRAN ODT) 4 MG disintegrating tablet Take 1 tablet (4 mg total) by mouth every 8 (eight) hours as needed for nausea or vomiting. 01/11/21  Yes Quintella Reichert, MD  diltiazem (CARDIZEM) 30 MG tablet Take 1 tablet (30 mg total) by mouth as needed (for palpitations/ fast heart rates). 06/24/13 12/08/19  Consuelo Pandy, PA-C    Allergies    Patient has no known allergies.  Review of Systems   Review of Systems  All other systems reviewed and are negative.  Physical Exam Updated Vital Signs BP 127/87 (BP Location: Right Arm)   Pulse 85   Temp 98.1 F (36.7 C) (Oral)   Resp 16   SpO2 98%   Physical Exam Vitals and nursing note reviewed.  Constitutional:      Appearance: He is well-developed.  HENT:     Head: Normocephalic and atraumatic.  Cardiovascular:     Rate and Rhythm: Regular rhythm.  Tachycardia present.     Heart sounds: No murmur heard. Pulmonary:     Effort: Pulmonary effort is normal. No respiratory distress.     Breath sounds: Normal breath sounds.  Abdominal:     Palpations: Abdomen is soft.     Tenderness: There is no abdominal tenderness. There is no guarding or rebound.  Musculoskeletal:        General: No swelling or tenderness.  Skin:    General: Skin is warm and dry.  Neurological:     Mental Status: He is alert and oriented to person, place, and time.  Psychiatric:        Behavior: Behavior normal.    ED Results / Procedures / Treatments   Labs (all labs ordered are listed, but only abnormal results are displayed) Labs Reviewed  CBC WITH  DIFFERENTIAL/PLATELET - Abnormal; Notable for the following components:      Result Value   WBC 14.0 (*)    Hemoglobin 17.3 (*)    Neutro Abs 10.5 (*)    Monocytes Absolute 1.4 (*)    All other components within normal limits  COMPREHENSIVE METABOLIC PANEL - Abnormal; Notable for the following components:   Sodium 133 (*)    Chloride 97 (*)    Glucose, Bld 142 (*)    Creatinine, Ser 1.26 (*)    Albumin 3.4 (*)    All other components within normal limits  D-DIMER, QUANTITATIVE - Abnormal; Notable for the following components:   D-Dimer, Quant 2.54 (*)    All other components within normal limits  TROPONIN I (HIGH SENSITIVITY) - Abnormal; Notable for the following components:   Troponin I (High Sensitivity) 22 (*)    All other components within normal limits  TROPONIN I (HIGH SENSITIVITY) - Abnormal; Notable for the following components:   Troponin I (High Sensitivity) 19 (*)    All other components within normal limits  RESP PANEL BY RT-PCR (FLU A&B, COVID) ARPGX2  CULTURE, BLOOD (ROUTINE X 2)  CULTURE, BLOOD (ROUTINE X 2)  LACTIC ACID, PLASMA    EKG EKG Interpretation  Date/Time:  Saturday January 11 2021 02:27:42 EDT Ventricular Rate:  103 PR Interval:  120 QRS Duration: 82 QT Interval:  344 QTC Calculation: 450 R Axis:   68 Text Interpretation: Sinus tachycardia Biatrial enlargement ST & T wave abnormality, consider inferolateral ischemia Abnormal ECG Confirmed by Quintella Reichert [phone]) on 01/11/2021 2:53:58 AM  Radiology DG Chest 2 View  Result Date: 01/10/2021 CLINICAL DATA:  Cough and fever.  Nausea and vomiting.  Pneumonia. EXAM: CHEST - 2 VIEW COMPARISON:  01/08/2021 FINDINGS: Heart size is normal. Right upper lobe airspace disease shows no significant change, suspicious for pneumonia. Left lung is clear. No evidence of pleural effusion. IMPRESSION: Stable right upper lobe airspace disease, suspicious for pneumonia. Electronically Signed   By: Marlaine Hind M.D.    On: 01/10/2021 18:21   CT Angio Chest PE W/Cm &/Or Wo Cm  Result Date: 01/11/2021 CLINICAL DATA:  Concern for pulmonary embolism. EXAM: CT ANGIOGRAPHY CHEST WITH CONTRAST TECHNIQUE: Multidetector CT imaging of the chest was performed using the standard protocol during bolus administration of intravenous contrast. Multiplanar CT image reconstructions and MIPs were obtained to evaluate the vascular anatomy. CONTRAST:  36mL OMNIPAQUE IOHEXOL 350 MG/ML SOLN COMPARISON:  Chest radiograph dated 01/10/2021. FINDINGS: Cardiovascular: There is no cardiomegaly or pericardial effusion. The thoracic aorta is unremarkable. No pulmonary artery embolus identified. Apparent faint focus of filling defect in the subsegmental left upper lobe pulmonary  artery branch (139/7) is artifactual. Mediastinum/Nodes: There is right hilar and mediastinal adenopathy. Right paratracheal lymph node measures 16 mm in short axis. The esophagus is grossly unremarkable. No mediastinal fluid collection. Lungs/Pleura: There is an area of consolidation involving the anterior segment of the right upper lobe most consistent with pneumonia. Clinical correlation and follow-up to resolution recommended to exclude an underlying mass. There is a 1.3 x 1.2 cm left lobe nodule with irregular, somewhat spiculated margins. There is no pleural effusion pneumothorax. The central airways are patent. Upper Abdomen: Fatty liver. Musculoskeletal: No chest wall abnormality. No acute or significant osseous findings. Review of the MIP images confirms the above findings. IMPRESSION: 1. No CT evidence of pulmonary artery embolus. 2. Right upper lobe consolidation most consistent with pneumonia. Clinical correlation and follow-up to resolution recommended to exclude an underlying mass. 3. Right hilar and mediastinal adenopathy, likely reactive. 4. A 1.3 x 1.2 cm left upper lobe nodule with irregular, somewhat spiculated margins. Multidisciplinary consult and further  evaluation with PET-CT, if clinically indicated, recommended. 5. Fatty liver. Electronically Signed   By: Anner Crete M.D.   On: 01/11/2021 03:39    Procedures Procedures   Medications Ordered in ED Medications  azithromycin (ZITHROMAX) tablet 500 mg (has no administration in time range)  ondansetron (ZOFRAN-ODT) disintegrating tablet 4 mg (4 mg Oral Given 01/10/21 1649)  sodium chloride 0.9 % bolus 1,000 mL (0 mLs Intravenous Stopped 01/11/21 0317)  cefTRIAXone (ROCEPHIN) 1 g in sodium chloride 0.9 % 100 mL IVPB (0 g Intravenous Stopped 01/11/21 0257)  ondansetron (ZOFRAN) injection 4 mg (4 mg Intravenous Given 01/11/21 0234)  iohexol (OMNIPAQUE) 350 MG/ML injection 80 mL (80 mLs Intravenous Contrast Given 01/11/21 0329)  acetaminophen (TYLENOL) tablet 650 mg (650 mg Oral Given 01/11/21 0424)    ED Course  I have reviewed the triage vital signs and the nursing notes.  Pertinent labs & imaging results that were available during my care of the patient were reviewed by me and considered in my medical decision making (see chart for details).    MDM Rules/Calculators/A&P                         decreased  patient with recent diagnosis of pneumonia started on antibiotics here for vomiting, feeling sicker. On evaluation he is non-toxic appearing with no respiratory distress given patient's relative lack of cough but presence of pleuritic chest pain a D dimer was obtained, which was elevated. A CTA was obtained, which is negative for PE but does demonstrate right upper lobe pneumonia. He was treated with broad antibiotics and IV fluids. He was observed in the emergency department for several hours without recurrent vomiting. He is able to tolerate oral intake well. On reassessment he appears improved. Plan to discharge home on oral antibiotics. Will broaden antibiotics to include azithromycin and Augmentin. Will also prescribe Zofran for nausea and vomiting. Discussed importance of return  precautions if he has worsening symptoms. Also discussed findings on CT scan and importance of getting follow-up regarding pulmonary nodule. Final Clinical Impression(s) / ED Diagnoses Final diagnoses:  Community acquired pneumonia of right upper lobe of lung  Pulmonary nodule    Rx / DC Orders ED Discharge Orders          Ordered    ondansetron (ZOFRAN ODT) 4 MG disintegrating tablet  Every 8 hours PRN        01/11/21 0646    azithromycin (ZITHROMAX) 250 MG tablet  Daily        01/11/21 0646             Tilden Fossa, MD 01/11/21 [phone]

## 2021-01-11 NOTE — ED Notes (Signed)
Pt tolerated fluids well. No nausea or vomiting at this time.

## 2021-01-11 NOTE — ED Notes (Signed)
Pt returned from CT scan Respirations are even and non-labored. NAD

## 2021-01-11 NOTE — ED Notes (Signed)
Pt provided snack and water for PO challenge

## 2021-01-16 LAB — CULTURE, BLOOD (ROUTINE X 2)
Culture: NO GROWTH
Culture: NO GROWTH
Special Requests: ADEQUATE

## 2021-07-05 ENCOUNTER — Other Ambulatory Visit: Payer: Self-pay

## 2021-07-05 ENCOUNTER — Encounter (HOSPITAL_COMMUNITY): Payer: Self-pay | Admitting: Emergency Medicine

## 2021-07-05 ENCOUNTER — Emergency Department (HOSPITAL_COMMUNITY)
Admission: EM | Admit: 2021-07-05 | Discharge: 2021-07-05 | Disposition: A | Discharge status: Home / Self Care | Payer: Managed Care, Other (non HMO) | Attending: Emergency Medicine | Admitting: Emergency Medicine

## 2021-07-05 DIAGNOSIS — E1165 Type 2 diabetes mellitus with hyperglycemia: Secondary | ICD-10-CM | POA: Diagnosis not present

## 2021-07-05 DIAGNOSIS — I10 Essential (primary) hypertension: Secondary | ICD-10-CM | POA: Insufficient documentation

## 2021-07-05 DIAGNOSIS — R739 Hyperglycemia, unspecified: Secondary | ICD-10-CM

## 2021-07-05 DIAGNOSIS — E119 Type 2 diabetes mellitus without complications: Secondary | ICD-10-CM

## 2021-07-05 DIAGNOSIS — R350 Frequency of micturition: Secondary | ICD-10-CM | POA: Diagnosis present

## 2021-07-05 DIAGNOSIS — E871 Hypo-osmolality and hyponatremia: Secondary | ICD-10-CM | POA: Diagnosis not present

## 2021-07-05 HISTORY — DX: Type 2 diabetes mellitus without complications: E11.9

## 2021-07-05 LAB — URINALYSIS, ROUTINE W REFLEX MICROSCOPIC
Bacteria, UA: NONE SEEN
Bilirubin Urine: NEGATIVE
Glucose, UA: >=500 mg/dL — AB
Hgb urine dipstick: NEGATIVE
Ketones, ur: NEGATIVE mg/dL
Leukocytes,Ua: NEGATIVE
Nitrite: NEGATIVE
Protein, ur: NEGATIVE mg/dL
Specific Gravity, Urine: 1.029 (ref 1.005–1.030)
pH: 6 (ref 5.0–8.0)

## 2021-07-05 LAB — BASIC METABOLIC PANEL
Anion gap: 9 (ref 5–15)
BUN: 8 mg/dL (ref 6–20)
CO2: 26 mmol/L (ref 22–32)
Calcium: 9.2 mg/dL (ref 8.9–10.3)
Chloride: 98 mmol/L (ref 98–111)
Creatinine, Ser: 1.11 mg/dL (ref 0.61–1.24)
GFR, Estimated: >60 mL/min (ref >60)
Glucose, Bld: 555 mg/dL (ref 70–99)
Potassium: 4.2 mmol/L (ref 3.5–5.1)
Sodium: 133 mmol/L — ABNORMAL LOW (ref 135–145)

## 2021-07-05 LAB — CBC
HCT: 49.5 % (ref 39.0–52.0)
Hemoglobin: 17 g/dL (ref 13.0–17.0)
MCH: 31.2 pg (ref 26.0–34.0)
MCHC: 34.3 g/dL (ref 30.0–36.0)
MCV: 90.8 fL (ref 80.0–100.0)
Platelets: 190 10*3/uL (ref 150–400)
RBC: 5.45 MIL/uL (ref 4.22–5.81)
RDW: 11.9 % (ref 11.5–15.5)
WBC: 6 10*3/uL (ref 4.0–10.5)
nRBC: 0 % (ref 0.0–0.2)

## 2021-07-05 LAB — CBG MONITORING, ED
Glucose-Capillary: 561 mg/dL (ref 70–99)
Glucose-Capillary: 510 mg/dL (ref 70–99)
Glucose-Capillary: 375 mg/dL — ABNORMAL HIGH (ref 70–99)

## 2021-07-05 LAB — HEMOGLOBIN A1C
Hgb A1c MFr Bld: 10.5 % — ABNORMAL HIGH (ref 4.8–5.6)
Mean Plasma Glucose: 254.65 mg/dL

## 2021-07-05 MED ORDER — INSULIN ASPART 100 UNIT/ML IJ SOLN
6.0000 [IU] | Freq: Once | INTRAMUSCULAR | Status: AC
  Administered 2021-07-05: 6 [IU] via SUBCUTANEOUS

## 2021-07-05 MED ORDER — BLOOD GLUCOSE MONITOR KIT: PACK | 0 refills | Status: DC

## 2021-07-05 MED ORDER — SODIUM CHLORIDE 0.9 % IV BOLUS
1000.0000 mL | Freq: Once | INTRAVENOUS | Status: AC
  Administered 2021-07-05: 1000 mL via INTRAVENOUS

## 2021-07-05 MED ORDER — METFORMIN HCL 500 MG PO TABS: 500.0000 mg | ORAL_TABLET | Freq: Two times a day (BID) | ORAL | 0 refills | Status: DC

## 2021-07-05 NOTE — Discharge Instructions (Addendum)
Unfortunately you were diagnosed with new onset type 2 diabetes. I have sent you home with a medication called metformin that you will take twice daily every day. The referral for the primary care doctor is provided above - just give their office a call on Monday to set up follow up care.  ? ?Remember to monitor your blood glucose at home and keep a log of your sugars. I've attached a document here regarding nutrition recommendations but there will be a lot of online resources about diabetic diets for you as well. If your glucose gets too low, (below 50-60) and you start to feel altered or weak, drink some juice or have a snack and consider coming to the ED for evaluation. ?

## 2021-07-05 NOTE — ED Provider Notes (Signed)
?[facility] ?Provider Note ? ? ?CSN: [id number] ?Arrival date & time: 07/05/21  1406 ? ?  ? ?History ? ?Chief Complaint  ?Patient presents with  ? fingers numb  ? Urinary Frequency  ? ? ?Ricardo Boyer is a 31 y.o. male. ? ?Ricardo Boyer is a 31 y.o. male who presents for numbness in left thumb, pointer finger, and middle finger and frequent urination. Pt has a hx of A - fib and a maternal family hx of DM. Pt states that for the past two weeks he has been frequently using the restroom. He works third shift and says that he has to wake up multiple times when he is sleeping to use the restroom which out of the norm. At first, he thought it was because he had started to drink more water, but the frequent urination kept occurring. He states that also he has been having some intermittent blurriness of vision since the frequency of urination occurred. Pt states that his vision turns back to normal quickly. He also states that he has a hx of cramps throughout his body and got cramps in his left pointer finger and middle finger two days ago (07/03/21) and now has pins and needles sensation starting yesterday. The numbness does not burn, but his fingers "do not feel right". Pt states that he drinks three bottles of soda a day and smokes 4 cigarettes daily. Denies alcohol, illicit drug use, or marijuana use. Denies fever, weight loss, nausea, vomiting, dysuria, abdominal pain, constipation, recent STD, illnesses, or recent UTI.  ? ?The history is provided by the patient.  ? ?  ? ?Home Medications ?Prior to Admission medications   ?Medication Sig Start Date End Date Taking? Authorizing Provider  ?amoxicillin-clavulanate (AUGMENTIN) 875-125 MG tablet Take 1 tablet by mouth every 12 (twelve) hours. 01/09/21   Montine Circle, PA-C  ?azithromycin (ZITHROMAX) 250 MG tablet Take 1 tablet (250 mg total) by mouth daily. Take first 2 tablets together, then 1 every day until finished. 01/11/21    Quintella Reichert, MD  ?benzonatate (TESSALON) 100 MG capsule Take 1 capsule (100 mg total) by mouth 2 (two) times daily as needed for cough. 01/09/21   Montine Circle, PA-C  ?ibuprofen (ADVIL) 200 MG tablet Take 200 mg by mouth every 6 (six) hours as needed for headache or mild pain.    [provider]  ?ondansetron (ZOFRAN ODT) 4 MG disintegrating tablet Take 1 tablet (4 mg total) by mouth every 8 (eight) hours as needed for nausea or vomiting. 01/11/21   Quintella Reichert, MD  ?diltiazem (CARDIZEM) 30 MG tablet Take 1 tablet (30 mg total) by mouth as needed (for palpitations/ fast heart rates). 06/24/13 12/08/19  Consuelo Pandy, PA-C  ?   ? ?Allergies    ?Patient has no known allergies.   ? ?Review of Systems   ?Review of Systems  ?Constitutional:  Negative for chills and fever.  ?HENT: Negative.    ?Respiratory:  Negative for cough and shortness of breath.   ?Cardiovascular:  Negative for chest pain.  ?Gastrointestinal:  Negative for abdominal pain, nausea and vomiting.  ?Endocrine: Positive for polydipsia and polyuria.  ?Genitourinary:  Positive for frequency. Negative for dysuria.  ?Neurological:  Positive for numbness. Negative for weakness.  ?All other systems reviewed and are negative. ? ?Physical Exam ?Updated Vital Signs ?BP (!) 147/94   Pulse 93   Temp 98.7 ?F (37.1 ?C)   Resp 14   SpO2 93%  ?Physical Exam ?Vitals and  nursing note reviewed.  ?Constitutional:   ?   General: He is not in acute distress. ?   Appearance: Normal appearance. He is well-developed. He is not diaphoretic.  ?HENT:  ?   Head: Normocephalic and atraumatic.  ?Eyes:  ?   General:     ?   Right eye: No discharge.     ?   Left eye: No discharge.  ?   Pupils: Pupils are equal, round, and reactive to light.  ?Cardiovascular:  ?   Rate and Rhythm: Normal rate and regular rhythm.  ?   Pulses: Normal pulses.  ?   Heart sounds: Normal heart sounds.  ?Pulmonary:  ?   Effort: Pulmonary effort is normal. No respiratory distress.  ?    Breath sounds: Normal breath sounds. No wheezing or rales.  ?   Comments: Respirations equal and unlabored, patient able to speak in full sentences, lungs clear to auscultation bilaterally  ?Abdominal:  ?   General: Bowel sounds are normal. There is no distension.  ?   Palpations: Abdomen is soft. There is no mass.  ?   Tenderness: There is no abdominal tenderness. There is no guarding.  ?   Comments: Abdomen soft, nondistended, nontender to palpation in all quadrants without guarding or peritoneal signs  ?Musculoskeletal:     ?   General: No deformity.  ?   Cervical back: Neck supple.  ?Skin: ?   General: Skin is warm and dry.  ?   Capillary Refill: Capillary refill takes less than 2 seconds.  ?Neurological:  ?   Mental Status: He is alert and oriented to person, place, and time.  ?   Coordination: Coordination normal.  ?   Comments: Speech is clear, able to follow commands ?CN III-XII intact ?Normal strength in upper and lower extremities bilaterally including dorsiflexion and plantar flexion, strong and equal grip strength ?Sensation normal to light and sharp touch ?Moves extremities without ataxia, coordination intact  ?Psychiatric:     ?   Mood and Affect: Mood normal.     ?   Behavior: Behavior normal.  ? ? ?ED Results / Procedures / Treatments   ?Labs ?(all labs ordered are listed, but only abnormal results are displayed) ?Labs Reviewed  ?BASIC METABOLIC PANEL - Abnormal; Notable for the following components:  ?    Result Value  ? Sodium 133 (*)   ? Glucose, Bld 555 (*)   ? All other components within normal limits  ?URINALYSIS, ROUTINE W REFLEX MICROSCOPIC - Abnormal; Notable for the following components:  ? Color, Urine STRAW (*)   ? Glucose, UA >=500 (*)   ? All other components within normal limits  ?CBG MONITORING, ED - Abnormal; Notable for the following components:  ? Glucose-Capillary 561 (*)   ? All other components within normal limits  ?CBC  ?CBG MONITORING, ED  ? ? ?EKG ?None ? ?Radiology ?No  results found. ? ?Procedures ?Procedures  ? ? ?Medications Ordered in ED ?Medications  ?sodium chloride 0.9 % bolus 1,000 mL (1,000 mLs Intravenous New Bag/Given 07/05/21 1535)  ? ? ?ED Course/ Medical Decision Making/ A&P ?  ?                        ?Medical Decision Making ?Amount and/or Complexity of Data Reviewed ?Labs: ordered. ? ? ?31 y.o. male presents to the ED with complaints of polydipsia paresthesias, this involves an extensive number of treatment options, and is a complaint that  carries with it a high risk of complications and morbidity.  The differential diagnosis includes diabetes, dehydration, electrolyte derangement, carpal tunnel syndrome  ? ?On arrival pt is nontoxic, vitals significant for mild hypertension, otherwise unremarkable. Exam without significant abnormalities, no focal neurologic deficits. ? ?Previous records obtained and reviewed ? ?I ordered medication IV fluid bolus for hyperglycemia ? ?Lab Tests:  ?I Ordered, reviewed, and interpreted labs, which included: Glucose of 561, with no prior known diabetes, expected mild hyponatremia but no other significant electrolyte derangements, normal renal function and no anion gap, no leukocytosis.  Labs consistent with hyperglycemia in the setting of new onset type 2 diabetes ? ? ?ED Course:  ? ?Patient will be given 1 L of IV fluids for management of hyperglycemia, then we will plan to recheck blood sugar, fortunately no evidence of DKA or HHS today. ? ?I had a long discussion with patient about the management of new onset diabetes, discussed changes to lifestyle and diet at length. ? ?Consulted TOC for assistance with PCP follow-up for patient, he does have seen no insurance, and they have placed follow-up information on patient's paperwork for him today.  He will call Monday to schedule appointment. ? ?At shift change care signed out to PA [facility] who will follow-up on recheck of glucose after IV fluids as long as glucose is continuing to  improve anticipate that the patient can be discharged home on metformin 500 mg twice daily with close outpatient follow-up.  Patient will also be prescribed glucose testing kit and supplies. ? ?Portions o

## 2021-07-05 NOTE — ED Notes (Signed)
Patient verbalizes understanding of d/c instructions. Opportunities for questions and answers were provided. Pt d/c from ED and ambulated to lobby.  

## 2021-07-05 NOTE — ED Triage Notes (Addendum)
C/o numbness to L thumb, index, and middle finger since yesterday.  No known injury. Pt gripping a drink bottle without difficulty.  Also reports frequent urination.  No history of diabetes. ? ?CBG 561 ?

## 2021-07-05 NOTE — Care Management (Signed)
Suggested PCP placed in Patient instructions.  ?

## 2021-07-05 NOTE — ED Provider Notes (Signed)
Care assumed from Jodi Geralds, PA-C at shift change, please see their note for full detail, but in brief Ricardo Boyer is a 31 y.o. male presents with finger numbness and increased urinary frequency in the setting of newly diagnosed DM. ? ?Plan: Administer fluids with goal of improving sugars with likely dc home with metformin ? ?Physical Exam  ?BP (!) 142/80   Pulse 78   Temp 98.7 ?F (37.1 ?C)   Resp 18   SpO2 93%  ? ?Physical Exam ? ?Procedures  ?Procedures ? ?ED Course / MDM  ?  ?Medical Decision Making ?Amount and/or Complexity of Data Reviewed ?Labs: ordered. ? ?Risk ?OTC drugs. ?Prescription drug management. ? ? ?Patient's blood sugar still at 511 after reevaluation following 1 L fluid bolus.  After discussion with my attending, Dr. Wilkie Aye, we will give small dose of 6 mg subcutaneous insulin along with another bolus of fluids.  On reevaluation after second bolus and after insulin administration, blood sugar improved to 375.  At this point, patient is stable for discharge.  Since he is insulin na?ve and at increased risk of hypoglycemia I did discuss with him signs and symptoms and when to return to the emergency department.  Referral was provided for family care follow-up.  I interpreted labs ordered by Jodi Geralds which shows hemoglobin A1c of 10.5. Prescribed metformin 500 mg twice daily for 1 month.  Patient provided information regarding dietary guidelines and blood glucose monitoring.  Patient expresses understanding is amenable to plan.  Discharged home in good condition ? ? ? ? ?  ?Janell Quiet, [place] ?07/05/21 2032 ? ?  ?Rozelle Logan, DO ?07/06/21 1140 ? ?

## 2021-07-13 NOTE — Progress Notes (Deleted)
Subjective:    Ricardo Boyer - 31 y.o. male MRN [id number]  Date of birth: Dec 31, 1990  HPI  Ricardo Boyer is to establish care and emergency department follow-up.  Current issues and/or concerns: Emergency department follow-up: 07/05/2021 [facility] Emergency Department per DO note:                         Medical Decision Making Amount and/or Complexity of Data Reviewed Labs: ordered.     31 y.o. male presents to the ED with complaints of polydipsia paresthesias, this involves an extensive number of treatment options, and is a complaint that carries with it a high risk of complications and morbidity.  The differential diagnosis includes diabetes, dehydration, electrolyte derangement, carpal tunnel syndrome    On arrival pt is nontoxic, vitals significant for mild hypertension, otherwise unremarkable. Exam without significant abnormalities, no focal neurologic deficits.   Previous records obtained and reviewed   I ordered medication IV fluid bolus for hyperglycemia   Lab Tests:  I Ordered, reviewed, and interpreted labs, which included: Glucose of 561, with no prior known diabetes, expected mild hyponatremia but no other significant electrolyte derangements, normal renal function and no anion gap, no leukocytosis.  Labs consistent with hyperglycemia in the setting of new onset type 2 diabetes     ED Course:    Patient will be given 1 L of IV fluids for management of hyperglycemia, then we will plan to recheck blood sugar, fortunately no evidence of DKA or HHS today.   I had a long discussion with patient about the management of new onset diabetes, discussed changes to lifestyle and diet at length.   Consulted TOC for assistance with PCP follow-up for patient, he does have seen no insurance, and they have placed follow-up information on patient's paperwork for him today.  He will call Monday to schedule appointment.   At shift change care signed out to PA [facility] who will follow-up on recheck of glucose after IV fluids as long as glucose is continuing to improve anticipate that the patient can be discharged home on metformin 500 mg twice daily with close outpatient follow-up.  Patient will also be prescribed glucose testing kit and supplies.   Portions of this note were generated with Lobbyist. Dictation errors may occur despite best attempts at proofreading.     Today's Visit 07/16/2021: Diabetes type 2 follow-up: Last A1C:  10.5% Are you fasting today: _0  Yes _1  No  Have you taken your anti-diabetic medications today: _2  Yes _3  No  Med Adherence:  _4  Yes    _5  No Medication side effects:  _6  Yes    _7  No Home Monitoring?  _8  Yes    _9  No Home glucose results range:*** Diet Adherence: _10  Yes    _11  No Exercise: _12  Yes    _13  No Hypoglycemic episodes?: _14  Yes    _15  No Numbness of the feet? _16  Yes    _17  No Retinopathy hx? _18  Yes    _19  No Last eye exam:  Comments: *** Increase Metformin recheck A1c 4 weeks    ROS per HPI     Health Maintenance:  Health Maintenance Due  Topic Date Due   COVID-19 Vaccine (1) Never done   URINE MICROALBUMIN  Never done   Hepatitis C Screening  Never done   TETANUS/TDAP  Never done     Past Medical History: Patient Active Problem List   Diagnosis Date Noted  Abnormal CXR 07/11/2013   Tobacco abuse 07/11/2013   Atrial fibrillation ([place]) 06/23/2013      Social History   reports that he has been smoking cigarettes. He has a 1.00 pack-year smoking history. He has never used smokeless tobacco. He reports that he does not drink alcohol and does not use drugs.   Family History  family history includes Diabetes in his mother.   Medications: reviewed and updated   Objective:   Physical Exam There were no vitals taken for this visit. Physical Exam      Assessment & Plan:         Patient was given clear instructions to go to Emergency Department or return to medical  center if symptoms don't improve, worsen, or new problems develop.The patient verbalized understanding.  I discussed the assessment and treatment plan with the patient. The patient was provided an opportunity to ask questions and all were answered. The patient agreed with the plan and demonstrated an understanding of the instructions.   The patient was advised to call back or seek an in-person evaluation if the symptoms worsen or if the condition fails to improve as anticipated.    Durene Fruits, NP 07/13/2021, 12:59 PM Primary Care at [facility]

## 2021-07-16 ENCOUNTER — Inpatient Hospital Stay: Payer: Managed Care, Other (non HMO) | Admitting: Family

## 2021-07-24 NOTE — Progress Notes (Signed)
Subjective:    Ricardo Boyer - 31 y.o. male MRN [id number]  Date of birth: 03-29-1990  HPI  Ricardo Boyer is to establish care and emergency department follow-up.  Current issues and/or concerns: Emergency department follow-up: 07/05/2021 [facility] Emergency Department per DO note:                         Medical Decision Making Amount and/or Complexity of Data Reviewed Labs: ordered.     31 y.o. male presents to the ED with complaints of polydipsia paresthesias, this involves an extensive number of treatment options, and is a complaint that carries with it a high risk of complications and morbidity.  The differential diagnosis includes diabetes, dehydration, electrolyte derangement, carpal tunnel syndrome    On arrival pt is nontoxic, vitals significant for mild hypertension, otherwise unremarkable. Exam without significant abnormalities, no focal neurologic deficits.   Previous records obtained and reviewed   I ordered medication IV fluid bolus for hyperglycemia   Lab Tests:  I Ordered, reviewed, and interpreted labs, which included: Glucose of 561, with no prior known diabetes, expected mild hyponatremia but no other significant electrolyte derangements, normal renal function and no anion gap, no leukocytosis.  Labs consistent with hyperglycemia in the setting of new onset type 2 diabetes     ED Course:    Patient will be given 1 L of IV fluids for management of hyperglycemia, then we will plan to recheck blood sugar, fortunately no evidence of DKA or HHS today.   I had a long discussion with patient about the management of new onset diabetes, discussed changes to lifestyle and diet at length.   Consulted TOC for assistance with PCP follow-up for patient, he does have seen no insurance, and they have placed follow-up information on patient's paperwork for him today.  He will call Monday to schedule appointment.   At shift change care signed out to PA [facility] who will follow-up on recheck of glucose after IV fluids as long as glucose is continuing to improve anticipate that the patient can be discharged home on metformin 500 mg twice daily with close outpatient follow-up.  Patient will also be prescribed glucose testing kit and supplies.   Portions of this note were generated with Lobbyist. Dictation errors may occur despite best attempts at proofreading.    Medical Decision Making Amount and/or Complexity of Data Reviewed Labs: ordered.   Risk OTC drugs. Prescription drug management.     Patient's blood sugar still at 511 after reevaluation following 1 L fluid bolus.  After discussion with my attending, Dr. Dina Rich, we will give small dose of 6 mg subcutaneous insulin along with another bolus of fluids.  On reevaluation after second bolus and after insulin administration, blood sugar improved to 375.  At this point, patient is stable for discharge.  Since he is insulin nave and at increased risk of hypoglycemia I did discuss with him signs and symptoms and when to return to the emergency department.  Referral was provided for family care follow-up.  I interpreted labs ordered by Benedetto Goad which shows hemoglobin A1c of 10.5. Prescribed metformin 500 mg twice daily for 1 month.  Patient provided information regarding dietary guidelines and blood glucose monitoring.  Patient expresses understanding is amenable to plan.  Discharged home in good condition   Today's Visit 07/16/2021: Diabetes type 2 follow-up: Last A1C:  10.5% on 07/05/2021 Med Adherence:  _0  Yes    _1   No Medication side effects:  _0  Yes, frequent bowel movements but manageable Home Monitoring?  _1  Yes    _2  No Home glucose results range: 100's - 140's Diet Adherence: trying Exercise: _3  Yes    _4  No Last eye exam: none   ROS per HPI     Health Maintenance:  Health Maintenance Due  Topic Date Due   COVID-19 Vaccine (1) Never done   URINE  MICROALBUMIN  Never done   Hepatitis C Screening  Never done   TETANUS/TDAP  Never done     Past Medical History: Patient Active Problem List   Diagnosis Date Noted   Abnormal CXR 07/11/2013   Tobacco abuse 07/11/2013   Atrial fibrillation ([place]) 06/23/2013     Social History   reports that he has been smoking cigarettes. He has a 1.00 pack-year smoking history. He has never used smokeless tobacco. He reports that he does not drink alcohol and does not use drugs.   Family History  family history includes Diabetes in his mother.   Medications: reviewed and updated   Objective:   Physical Exam BP 130/82 (BP Location: Left Arm, Patient Position: Sitting, Cuff Size: Large)   Pulse 79   Temp 98.3 F (36.8 C)   Resp 18   Wt 191 lb (86.6 kg)   SpO2 96%   BMI 28.21 kg/m   Physical Exam HENT:     Head: Normocephalic and atraumatic.  Eyes:     Extraocular Movements: Extraocular movements intact.     Conjunctiva/sclera: Conjunctivae normal.     Pupils: Pupils are equal, round, and reactive to light.  Cardiovascular:     Rate and Rhythm: Normal rate and regular rhythm.     Pulses: Normal pulses.     Heart sounds: Normal heart sounds.  Pulmonary:     Effort: Pulmonary effort is normal.     Breath sounds: Normal breath sounds.  Musculoskeletal:     Cervical back: Normal range of motion and neck supple.  Neurological:     General: No focal deficit present.     Mental Status: He is alert and oriented to person, place, and time.  Psychiatric:        Mood and Affect: Mood normal.        Behavior: Behavior normal.      Assessment & Plan:  1. Encounter to establish care: - Patient presents today to establish care.  - Return for annual physical examination, labs, and health maintenance. Arrive fasting meaning having no food for at least 8 hours prior to appointment. You may have only water or black coffee. Please take scheduled medications as normal.  2. New onset type 2  diabetes mellitus ([place]): 3. Hyperglycemia: - Hemoglobin A1c 10.5% on 07/05/2021. - Increase Metformin from 500 mg twice daily to 1000 mg twice daily. Also, changing Metformin from regular release to extended release with goal of decreasing gastrointestinal side effects.  - Begin Dapagliflozin Propanediol as prescribed. Counseled on medication adherence and adverse effects.  - Discussed the importance of healthy eating habits, low-carbohydrate diet, low-sugar diet, regular aerobic exercise (at least 150 minutes a week as tolerated) and medication compliance to achieve or maintain control of diabetes. - To achieve an A1C goal of less than or equal to 7.0 percent, a fasting blood sugar of 80 to 130 mg/dL and a postprandial glucose (90 to 120 minutes after a meal) less than 180 mg/dL. In the event of sugars less than 60 mg/dl or greater than 400 mg/dl  please notify the clinic ASAP. It is recommended that you undergo annual eye exams and annual foot exams. - Follow-up with primary provider in 4 weeks or sooner if needed. Will recheck hemoglobin A1c at that time. - dapagliflozin propanediol (FARXIGA) 5 MG TABS tablet; Take 1 tablet (5 mg total) by mouth daily before breakfast.  Dispense: 30 tablet; Refill: 0 - metFORMIN (GLUCOPHAGE-XR) 500 MG 24 hr tablet; Take 2 tablets (1,000 mg total) by mouth 2 (two) times daily with a meal.  Dispense: 120 tablet; Refill: 0  4. Diabetic eye exam [facility]): - Referral to Ophthalmology for further evaluation and management.  - Ambulatory referral to Ophthalmology    Patient was given clear instructions to go to Emergency Department or return to medical center if symptoms don't improve, worsen, or new problems develop.The patient verbalized understanding.  I discussed the assessment and treatment plan with the patient. The patient was provided an opportunity to ask questions and all were answered. The patient agreed with the plan and demonstrated an understanding of the  instructions.   The patient was advised to call back or seek an in-person evaluation if the symptoms worsen or if the condition fails to improve as anticipated.    Durene Fruits, NP 07/29/2021, 3:51 PM Primary Care at [facility]

## 2021-07-29 ENCOUNTER — Encounter: Payer: Self-pay | Admitting: Family

## 2021-07-29 ENCOUNTER — Ambulatory Visit (INDEPENDENT_AMBULATORY_CARE_PROVIDER_SITE_OTHER): Payer: Managed Care, Other (non HMO) | Admitting: Family

## 2021-07-29 VITALS — BP 130/82 | HR 79 | Temp 98.3°F | Resp 18 | Wt 191.0 lb

## 2021-07-29 DIAGNOSIS — E119 Type 2 diabetes mellitus without complications: Secondary | ICD-10-CM | POA: Diagnosis not present

## 2021-07-29 DIAGNOSIS — Z0100 Encounter for examination of eyes and vision without abnormal findings: Secondary | ICD-10-CM

## 2021-07-29 DIAGNOSIS — Z7689 Persons encountering health services in other specified circumstances: Secondary | ICD-10-CM

## 2021-07-29 DIAGNOSIS — R739 Hyperglycemia, unspecified: Secondary | ICD-10-CM | POA: Diagnosis not present

## 2021-07-29 MED ORDER — DAPAGLIFLOZIN PROPANEDIOL 5 MG PO TABS: 5.0000 mg | ORAL_TABLET | Freq: Every day | ORAL | 0 refills | Status: DC

## 2021-07-29 MED ORDER — METFORMIN HCL ER 500 MG PO TB24: 1000.0000 mg | ORAL_TABLET | Freq: Two times a day (BID) | ORAL | 0 refills | Status: DC

## 2021-07-29 NOTE — Progress Notes (Signed)
Pt presents for ED f/u states that he's doing fine but the metformin causing diarrhea  States BS runs around 103 or less daily

## 2021-08-01 ENCOUNTER — Other Ambulatory Visit: Payer: Self-pay | Admitting: Family

## 2021-08-01 DIAGNOSIS — E119 Type 2 diabetes mellitus without complications: Secondary | ICD-10-CM

## 2021-08-18 ENCOUNTER — Ambulatory Visit
Admission: EM | Admit: 2021-08-18 | Discharge: 2021-08-18 | Disposition: A | Discharge status: Home / Self Care | Payer: Managed Care, Other (non HMO) | Attending: Internal Medicine | Admitting: Internal Medicine

## 2021-08-18 DIAGNOSIS — Z113 Encounter for screening for infections with a predominantly sexual mode of transmission: Secondary | ICD-10-CM | POA: Insufficient documentation

## 2021-08-18 NOTE — Discharge Instructions (Signed)
Your STD test is pending.  We will call if it is positive and treat as appropriate.  Please refrain from sexual activity until test results and treatment are complete if treatment is appropriate.

## 2021-08-18 NOTE — ED Triage Notes (Addendum)
Patient presents to Urgent Care with co of partner cheated on him and he wants to be checked for stis no current symptoms other than a strange sensation when he ejaculates.

## 2021-08-18 NOTE — ED Provider Notes (Signed)
[facility]    CSN: [id number] Arrival date & time: 08/18/21  1127      History   Chief Complaint No chief complaint on file.   HPI Ricardo Boyer is a 31 y.o. male.   Patient presents for routine STD testing after learning that his sexual partner was unfaithful.  He does not have any confirmed exposure or any associated symptoms.  Patient would like blood work for HIV and syphilis.     Past Medical History:  Diagnosis Date   Diabetes mellitus without complication ([place])    Dysrhythmia    atrial fibrilation   Gonorrhea     Patient Active Problem List   Diagnosis Date Noted   Abnormal CXR 07/11/2013   Tobacco abuse 07/11/2013   Atrial fibrillation ([place]) 06/23/2013    No past surgical history on file.     Home Medications    Prior to Admission medications   Medication Sig Start Date End Date Taking? Authorizing Provider  ACCU-CHEK GUIDE test strip 4 (four) times daily. as directed 07/06/21   [provider]  Accu-Chek Softclix Lancets lancets 4 (four) times daily. 07/06/21   [provider]  blood glucose meter kit and supplies KIT Dispense based on patient and insurance preference. Use up to four times daily as directed. 07/05/21   Jacqlyn Larsen, PA-C  dapagliflozin propanediol (FARXIGA) 5 MG TABS tablet Take 1 tablet (5 mg total) by mouth daily before breakfast. 07/29/21   Camillia Herter, NP  ibuprofen (ADVIL) 200 MG tablet Take 200 mg by mouth every 6 (six) hours as needed for headache or mild pain.    [provider]  metFORMIN (GLUCOPHAGE-XR) 500 MG 24 hr tablet Take 2 tablets (1,000 mg total) by mouth 2 (two) times daily with a meal. Patient not taking: Reported on 08/18/2021 07/29/21 08/28/21  Camillia Herter, NP  diltiazem (CARDIZEM) 30 MG tablet Take 1 tablet (30 mg total) by mouth as needed (for palpitations/ fast heart rates). 06/24/13 12/08/19  Consuelo Pandy, PA-C    Family History Family History  Problem  Relation Age of Onset   Diabetes Mother     Social History Social History   Tobacco Use   Smoking status: Heavy Smoker    Packs/day: 0.50    Years: 2.00    Total pack years: 1.00    Types: Cigarettes   Smokeless tobacco: Never  Vaping Use   Vaping Use: Never used  Substance Use Topics   Alcohol use: Never   Drug use: No     Allergies   Patient has no known allergies.   Review of Systems Review of Systems Per HPI  Physical Exam Triage Vital Signs ED Triage Vitals  Enc Vitals Group     BP 08/18/21 1237 (!) 141/96     Pulse Rate 08/18/21 1237 74     Resp 08/18/21 1237 18     Temp 08/18/21 1237 98.1 F (36.7 C)     Temp src --      SpO2 08/18/21 1237 98 %     Weight --      Height --      Head Circumference --      Peak Flow --      Pain Score 08/18/21 1236 0     Pain Loc --      Pain Edu? --      Excl. in [place]? --    No data found.  Updated Vital Signs BP (!) 141/96  Pulse 74   Temp 98.1 F (36.7 C)   Resp 18   SpO2 98%   Visual Acuity Right Eye Distance:   Left Eye Distance:   Bilateral Distance:    Right Eye Near:   Left Eye Near:    Bilateral Near:     Physical Exam Constitutional:      Appearance: Normal appearance.  HENT:     Head: Normocephalic and atraumatic.  Eyes:     Extraocular Movements: Extraocular movements intact.     Conjunctiva/sclera: Conjunctivae normal.  Pulmonary:     Effort: Pulmonary effort is normal.  Genitourinary:    Comments: Deferred with shared decision-making.  Self swab performed. Neurological:     General: No focal deficit present.     Mental Status: He is alert and oriented to person, place, and time. Mental status is at baseline.  Psychiatric:        Mood and Affect: Mood normal.        Behavior: Behavior normal.        Thought Content: Thought content normal.        Judgment: Judgment normal.      UC Treatments / Results  Labs (all labs ordered are listed, but only abnormal results are  displayed) Labs Reviewed  RPR  HIV ANTIBODY (ROUTINE TESTING W REFLEX)  CYTOLOGY, (ORAL, ANAL, URETHRAL) ANCILLARY ONLY    EKG   Radiology No results found.  Procedures Procedures (including critical care time)  Medications Ordered in UC Medications - No data to display  Initial Impression / Assessment and Plan / UC Course  I have reviewed the triage vital signs and the nursing notes.  Pertinent labs & imaging results that were available during my care of the patient were reviewed by me and considered in my medical decision making (see chart for details).     Cytology swab, RPR, HIV pending.  Will await results for treatment if appropriate.  Patient to refrain from sexual activity until test results and treatment are complete.  Patient verbalized understanding and was agreeable with plan. Final Clinical Impressions(s) / UC Diagnoses   Final diagnoses:  Screening examination for venereal disease     Discharge Instructions      Your STD test is pending.  We will call if it is positive and treat as appropriate.  Please refrain from sexual activity until test results and treatment are complete if treatment is appropriate.    ED Prescriptions   None    PDMP not reviewed this encounter.   Teodora Medici, [place] 08/18/21 1355

## 2021-08-18 NOTE — Progress Notes (Signed)
Patient ID: Ricardo Boyer, male    DOB: Nov 05, 1990  MRN: [id number]  CC: Diabetes Follow-Up  Subjective: Ricardo Boyer is a 31 y.o. male who presents for diabetes follow-up.  His concerns today include:  Diabetes type 2 follow-up: 07/29/2021: - Hemoglobin A1c 10.5% on 07/05/2021. - Increase Metformin from 500 mg twice daily to 1000 mg twice daily. Also, changing Metformin from regular release to extended release with goal of decreasing gastrointestinal side effects.  - Begin Dapagliflozin Propanediol as prescribed. Counseled on medication adherence and adverse effects.   08/26/2021: Reports doing well on current Metformin. Did not begin Dapagliflozin Propanediol since last appointment due to not picking up prescription from pharmacy. Home blood sugars 130's.    Patient Active Problem List   Diagnosis Date Noted   Abnormal CXR 07/11/2013   Tobacco abuse 07/11/2013   Atrial fibrillation ([place]) 06/23/2013     Current Outpatient Medications on File Prior to Visit  Medication Sig Dispense Refill   ACCU-CHEK GUIDE test strip 4 (four) times daily. as directed     Accu-Chek Softclix Lancets lancets 4 (four) times daily.     blood glucose meter kit and supplies KIT Dispense based on patient and insurance preference. Use up to four times daily as directed. 1 each 0   ibuprofen (ADVIL) 200 MG tablet Take 200 mg by mouth every 6 (six) hours as needed for headache or mild pain.     [DISCONTINUED] diltiazem (CARDIZEM) 30 MG tablet Take 1 tablet (30 mg total) by mouth as needed (for palpitations/ fast heart rates). 30 tablet 3   No current facility-administered medications on file prior to visit.    No Known Allergies  Social History   Socioeconomic History   Marital status: Single    Spouse name: Not on file   Number of children: 2   Years of education: Not on file   Highest education level: Not on file  Occupational History   Occupation: WASTE MANAGEMENT    Employer: [employer]  Tobacco Use   Smoking status: Heavy Smoker    Packs/day: 0.50    Years: 2.00    Total pack years: 1.00    Types: Cigarettes    Passive exposure: Current   Smokeless tobacco: Never  Vaping Use   Vaping Use: Never used  Substance and Sexual Activity   Alcohol use: Never   Drug use: No   Sexual activity: Yes  Other Topics Concern   Not on file  Social History Narrative   Not on file   Social Determinants of Health   Financial Resource Strain: Not on file  Food Insecurity: Not on file  Transportation Needs: Not on file  Physical Activity: Not on file  Stress: Not on file  Social Connections: Not on file  Intimate Partner Violence: Not on file    Family History  Problem Relation Age of Onset   Diabetes Mother     No past surgical history on file.  ROS: Review of Systems Negative except as stated above  PHYSICAL EXAM: BP 122/83 (BP Location: Left Arm, Patient Position: Sitting, Cuff Size: Large)   Pulse 79   Temp 98.3 F (36.8 C)   Resp 16   Ht 5' 8.86" (1.749 m)   Wt 194 lb (88 kg)   SpO2 93%   BMI 28.77 kg/m    Physical Exam HENT:     Head: Normocephalic and atraumatic.  Eyes:     Extraocular Movements: Extraocular movements intact.  Conjunctiva/sclera: Conjunctivae normal.     Pupils: Pupils are equal, round, and reactive to light.  Cardiovascular:     Rate and Rhythm: Normal rate and regular rhythm.     Pulses: Normal pulses.     Heart sounds: Normal heart sounds.  Pulmonary:     Effort: Pulmonary effort is normal.     Breath sounds: Normal breath sounds.  Musculoskeletal:     Cervical back: Normal range of motion and neck supple.  Neurological:     General: No focal deficit present.     Mental Status: He is alert and oriented to person, place, and time.  Psychiatric:        Mood and Affect: Mood normal.        Behavior: Behavior normal.   Results for orders placed or performed in visit on 08/26/21  POCT glycosylated hemoglobin  (Hb A1C)  Result Value Ref Range   Hemoglobin A1C 9.0 (A) 4.0 - 5.6 %   HbA1c POC (<> result, manual entry)     HbA1c, POC (prediabetic range)     HbA1c, POC (controlled diabetic range)      ASSESSMENT AND PLAN: 1. Type 2 diabetes mellitus without complication, without long-term current use of insulin (HCC) - Hemoglobin A1c today not at goal at 9.0%, goal < 7%. However, this is improved from previous 10.5% on 07/05/2021. - Take Metformin as prescribed.  - Begin Dapagliflozin Propanediol as prescribed.  - Discussed the importance of healthy eating habits, low-carbohydrate diet, low-sugar diet, regular aerobic exercise (at least 150 minutes a week as tolerated) and medication compliance to achieve or maintain control of diabetes. - Follow-up with clinical pharmacist in 4 weeks for diabetes checkup. Write your home blood sugar results down each day and bring those results to your appointment along with your home glucose monitor. Medications may be revised at that time if needed. - POCT glycosylated hemoglobin (Hb A1C) - dapagliflozin propanediol (FARXIGA) 5 MG TABS tablet; Take 1 tablet (5 mg total) by mouth daily before breakfast.  Dispense: 30 tablet; Refill: 0 - metFORMIN (GLUCOPHAGE-XR) 500 MG 24 hr tablet; Take 2 tablets (1,000 mg total) by mouth 2 (two) times daily with a meal.  Dispense: 120 tablet; Refill: 3   Patient was given the opportunity to ask questions.  Patient verbalized understanding of the plan and was able to repeat key elements of the plan. Patient was given clear instructions to go to Emergency Department or return to medical center if symptoms don't improve, worsen, or new problems develop.The patient verbalized understanding.   Orders Placed This Encounter  Procedures   POCT glycosylated hemoglobin (Hb A1C)     Requested Prescriptions   Signed Prescriptions Disp Refills   dapagliflozin propanediol (FARXIGA) 5 MG TABS tablet 30 tablet 0    Sig: Take 1 tablet (5 mg  total) by mouth daily before breakfast.   metFORMIN (GLUCOPHAGE-XR) 500 MG 24 hr tablet 120 tablet 3    Sig: Take 2 tablets (1,000 mg total) by mouth 2 (two) times daily with a meal.    Return in about 4 weeks (around 09/23/2021) for Follow-Up or next available [place], CPP-RPH at [facility] .  Camillia Herter, NP

## 2021-08-19 ENCOUNTER — Telehealth (HOSPITAL_COMMUNITY): Payer: Self-pay | Admitting: Emergency Medicine

## 2021-08-19 LAB — CYTOLOGY, (ORAL, ANAL, URETHRAL) ANCILLARY ONLY
Chlamydia: NEGATIVE
Comment: NEGATIVE
Comment: NEGATIVE
Comment: NORMAL
Neisseria Gonorrhea: NEGATIVE
Trichomonas: POSITIVE — AB

## 2021-08-19 LAB — RPR: RPR Ser Ql: NONREACTIVE

## 2021-08-19 LAB — HIV ANTIBODY (ROUTINE TESTING W REFLEX): HIV Screen 4th Generation wRfx: NONREACTIVE

## 2021-08-19 MED ORDER — METRONIDAZOLE 500 MG PO TABS: 2000.0000 mg | ORAL_TABLET | Freq: Once | ORAL | 0 refills | Status: AC

## 2021-08-26 ENCOUNTER — Ambulatory Visit (INDEPENDENT_AMBULATORY_CARE_PROVIDER_SITE_OTHER): Payer: Managed Care, Other (non HMO) | Admitting: Family

## 2021-08-26 ENCOUNTER — Encounter: Payer: Self-pay | Admitting: Family

## 2021-08-26 VITALS — BP 122/83 | HR 79 | Temp 98.3°F | Resp 16 | Ht 68.86 in | Wt 194.0 lb

## 2021-08-26 DIAGNOSIS — E119 Type 2 diabetes mellitus without complications: Secondary | ICD-10-CM

## 2021-08-26 LAB — POCT GLYCOSYLATED HEMOGLOBIN (HGB A1C): Hemoglobin A1C: 9 % — AB (ref 4.0–5.6)

## 2021-08-26 MED ORDER — DAPAGLIFLOZIN PROPANEDIOL 5 MG PO TABS: 5.0000 mg | ORAL_TABLET | Freq: Every day | ORAL | 0 refills | Status: DC

## 2021-08-26 MED ORDER — METFORMIN HCL ER 500 MG PO TB24: 1000.0000 mg | ORAL_TABLET | Freq: Two times a day (BID) | ORAL | 3 refills | Status: DC

## 2021-08-26 NOTE — Progress Notes (Signed)
Pt presents for diabetes for follow-up, only taking 500 mg a day for Metformin due to make stomach hurt

## 2021-09-15 NOTE — Progress Notes (Signed)
Erroneous encounter-disregard

## 2021-09-22 ENCOUNTER — Encounter: Payer: Managed Care, Other (non HMO) | Admitting: Family

## 2021-09-22 DIAGNOSIS — Z1329 Encounter for screening for other suspected endocrine disorder: Secondary | ICD-10-CM

## 2021-09-22 DIAGNOSIS — Z1159 Encounter for screening for other viral diseases: Secondary | ICD-10-CM

## 2021-09-22 DIAGNOSIS — Z13220 Encounter for screening for lipoid disorders: Secondary | ICD-10-CM

## 2021-09-22 DIAGNOSIS — Z0000 Encounter for general adult medical examination without abnormal findings: Secondary | ICD-10-CM

## 2021-09-22 DIAGNOSIS — E119 Type 2 diabetes mellitus without complications: Secondary | ICD-10-CM

## 2021-10-03 ENCOUNTER — Ambulatory Visit: Payer: Managed Care, Other (non HMO) | Admitting: Pharmacist

## 2021-10-03 NOTE — Progress Notes (Signed)
Erroneous encounter-disregard

## 2021-10-10 ENCOUNTER — Encounter: Payer: Managed Care, Other (non HMO) | Admitting: Family

## 2021-10-10 DIAGNOSIS — Z1159 Encounter for screening for other viral diseases: Secondary | ICD-10-CM

## 2021-10-10 DIAGNOSIS — E119 Type 2 diabetes mellitus without complications: Secondary | ICD-10-CM

## 2021-10-10 DIAGNOSIS — Z13228 Encounter for screening for other metabolic disorders: Secondary | ICD-10-CM

## 2021-10-10 DIAGNOSIS — Z1329 Encounter for screening for other suspected endocrine disorder: Secondary | ICD-10-CM

## 2021-10-10 DIAGNOSIS — Z0000 Encounter for general adult medical examination without abnormal findings: Secondary | ICD-10-CM

## 2021-10-10 DIAGNOSIS — Z13220 Encounter for screening for lipoid disorders: Secondary | ICD-10-CM

## 2021-11-27 ENCOUNTER — Encounter (HOSPITAL_COMMUNITY): Payer: Self-pay | Admitting: Emergency Medicine

## 2021-11-27 ENCOUNTER — Emergency Department (HOSPITAL_COMMUNITY)
Admission: EM | Admit: 2021-11-27 | Discharge: 2021-11-27 | Disposition: A | Discharge status: Home / Self Care | Payer: Managed Care, Other (non HMO) | Attending: Emergency Medicine | Admitting: Emergency Medicine

## 2021-11-27 ENCOUNTER — Other Ambulatory Visit: Payer: Self-pay

## 2021-11-27 DIAGNOSIS — Z202 Contact with and (suspected) exposure to infections with a predominantly sexual mode of transmission: Secondary | ICD-10-CM | POA: Diagnosis not present

## 2021-11-27 DIAGNOSIS — E119 Type 2 diabetes mellitus without complications: Secondary | ICD-10-CM | POA: Insufficient documentation

## 2021-11-27 DIAGNOSIS — R519 Headache, unspecified: Secondary | ICD-10-CM | POA: Diagnosis present

## 2021-11-27 DIAGNOSIS — Z7984 Long term (current) use of oral hypoglycemic drugs: Secondary | ICD-10-CM | POA: Diagnosis not present

## 2021-11-27 LAB — CBG MONITORING, ED: Glucose-Capillary: 105 mg/dL — ABNORMAL HIGH (ref 70–99)

## 2021-11-27 LAB — URINALYSIS, ROUTINE W REFLEX MICROSCOPIC
Bilirubin Urine: NEGATIVE
Glucose, UA: NEGATIVE mg/dL
Hgb urine dipstick: NEGATIVE
Ketones, ur: NEGATIVE mg/dL
Leukocytes,Ua: NEGATIVE
Nitrite: NEGATIVE
Protein, ur: NEGATIVE mg/dL
Specific Gravity, Urine: 1.023 (ref 1.005–1.030)
pH: 7 (ref 5.0–8.0)

## 2021-11-27 LAB — HIV ANTIBODY (ROUTINE TESTING W REFLEX): HIV Screen 4th Generation wRfx: NONREACTIVE

## 2021-11-27 LAB — RPR: RPR Ser Ql: NONREACTIVE

## 2021-11-27 MED ORDER — AZITHROMYCIN 250 MG PO TABS
1000.0000 mg | ORAL_TABLET | Freq: Once | ORAL | Status: AC
  Administered 2021-11-27: 1000 mg via ORAL
  Filled 2021-11-27: qty 4

## 2021-11-27 MED ORDER — CEFTRIAXONE SODIUM 500 MG IJ SOLR
500.0000 mg | Freq: Once | INTRAMUSCULAR | Status: AC
  Administered 2021-11-27: 500 mg via INTRAMUSCULAR
  Filled 2021-11-27: qty 500

## 2021-11-27 MED ORDER — LIDOCAINE HCL (PF) 1 % IJ SOLN: 1.0000 mL | Freq: Once | INTRAMUSCULAR | Status: DC

## 2021-11-27 MED ORDER — METRONIDAZOLE 500 MG PO TABS
2000.0000 mg | ORAL_TABLET | Freq: Once | ORAL | Status: AC
  Administered 2021-11-27: 2000 mg via ORAL
  Filled 2021-11-27: qty 4

## 2021-11-27 NOTE — Discharge Instructions (Signed)
Return for any new or worse symptoms. °

## 2021-11-27 NOTE — ED Notes (Signed)
Pt reports a headache for the past x3 days, denies hx. States headache has resolved. NAD, VSS.

## 2021-11-27 NOTE — ED Notes (Signed)
Pt refused d/c vital signs 

## 2021-11-27 NOTE — ED Provider Notes (Addendum)
[facility] EMERGENCY DEPARTMENT Provider Note   CSN: [id number] Arrival date & time: 11/27/21  0222     History  Chief Complaint  Patient presents with   Headache / STD check     Ricardo Boyer is a 31 y.o. male.  Patient not concerned about STD TD exposure.  Has no discharge but says symptoms he knows are it.  Also had complaint of headache but that is resolved.  Feels fine.  Patient had lab work done while waiting in triage.  The RPR was nonreactive urinalysis negative.  Past medical history significant for diabetes patient on oral hypoglycemics.       Home Medications Prior to Admission medications   Medication Sig Start Date End Date Taking? Authorizing Provider  blood glucose meter kit and supplies KIT Dispense based on patient and insurance preference. Use up to four times daily as directed. 07/05/21   Jacqlyn Larsen, PA-C  dapagliflozin propanediol (FARXIGA) 5 MG TABS tablet Take 1 tablet (5 mg total) by mouth daily before breakfast. Patient not taking: Reported on 11/27/2021 08/26/21   Camillia Herter, NP  metFORMIN (GLUCOPHAGE-XR) 500 MG 24 hr tablet Take 2 tablets (1,000 mg total) by mouth 2 (two) times daily with a meal. Patient not taking: Reported on 11/27/2021 08/26/21 12/24/21  Camillia Herter, NP  diltiazem (CARDIZEM) 30 MG tablet Take 1 tablet (30 mg total) by mouth as needed (for palpitations/ fast heart rates). 06/24/13 12/08/19  Consuelo Pandy, PA-C      Allergies    Glucophage [metformin]    Review of Systems   Review of Systems  Constitutional:  Negative for chills and fever.  HENT:  Negative for ear pain and sore throat.   Eyes:  Negative for pain and visual disturbance.  Respiratory:  Negative for cough and shortness of breath.   Cardiovascular:  Negative for chest pain and palpitations.  Gastrointestinal:  Negative for abdominal pain and vomiting.  Genitourinary:  Negative for dysuria and hematuria.  Musculoskeletal:   Negative for arthralgias and back pain.  Skin:  Negative for color change and rash.  Neurological:  Positive for headaches. Negative for seizures and syncope.  All other systems reviewed and are negative.   Physical Exam Updated Vital Signs BP (!) 125/97 (BP Location: Right Arm)   Pulse 69   Temp 98.2 F (36.8 C) (Oral)   Resp 16   SpO2 95%  Physical Exam Vitals and nursing note reviewed.  Constitutional:      General: He is not in acute distress.    Appearance: Normal appearance. He is well-developed.  HENT:     Head: Normocephalic and atraumatic.  Eyes:     Extraocular Movements: Extraocular movements intact.     Conjunctiva/sclera: Conjunctivae normal.     Pupils: Pupils are equal, round, and reactive to light.  Cardiovascular:     Rate and Rhythm: Normal rate and regular rhythm.     Heart sounds: No murmur heard. Pulmonary:     Effort: Pulmonary effort is normal. No respiratory distress.     Breath sounds: Normal breath sounds.  Abdominal:     Palpations: Abdomen is soft.     Tenderness: There is no abdominal tenderness.  Genitourinary:    Penis: Normal.      Comments: No penile discharge patient circumcised testes descended nontender no mass no groin adenopathy no evidence of hernia no penile lesions. Musculoskeletal:        General: No swelling.  Cervical back: Normal range of motion and neck supple.  Skin:    General: Skin is warm and dry.     Capillary Refill: Capillary refill takes less than 2 seconds.  Neurological:     General: No focal deficit present.     Mental Status: He is alert and oriented to person, place, and time.     Cranial Nerves: No cranial nerve deficit.     Sensory: No sensory deficit.  Psychiatric:        Mood and Affect: Mood normal.     ED Results / Procedures / Treatments   Labs (all labs ordered are listed, but only abnormal results are displayed) Labs Reviewed  CBG MONITORING, ED - Abnormal; Notable for the following  components:      Result Value   Glucose-Capillary 105 (*)    All other components within normal limits  RPR  URINALYSIS, ROUTINE W REFLEX MICROSCOPIC  HIV ANTIBODY (ROUTINE TESTING W REFLEX)  GC/CHLAMYDIA PROBE AMP ([place]) NOT AT [facility]    EKG None  Radiology No results found.  Procedures Procedures    Medications Ordered in ED Medications  cefTRIAXone (ROCEPHIN) injection 500 mg (has no administration in time range)  lidocaine (PF) (XYLOCAINE) 1 % injection 1-2.1 mL (has no administration in time range)  azithromycin (ZITHROMAX) tablet 1,000 mg (has no administration in time range)  metroNIDAZOLE (FLAGYL) tablet 2,000 mg (has no administration in time range)    ED Course/ Medical Decision Making/ A&P                           Medical Decision Making Risk Prescription drug management.   Patient with STD exposure.  Patient does not want to be swabbed.  Patient states there is no discharge no discharge on exam.  Patient's basic labs RPR nonreactive urinalysis normal.  Patient be treated with Rocephin azithromycin and Flagyl here.  Patient states that headache is completely resolved.  He is not concerned about that at this point in time.   Final Clinical Impression(s) / ED Diagnoses Final diagnoses:  None    Rx / DC Orders ED Discharge Orders     None         Fredia Sorrow, MD 11/27/21 1059    Fredia Sorrow, MD 11/27/21 1114

## 2021-11-27 NOTE — ED Triage Notes (Signed)
Patient reports headache for 3 days , denies head injury , also requesting STD screening , denies penile discharge or discomfort .

## 2021-11-27 NOTE — ED Provider Triage Note (Signed)
Emergency Medicine Provider Triage Evaluation Note  Ricardo Boyer , a 31 y.o. male  was evaluated in triage.  Pt complains of headache & concern for STD.   Left sided headache x 3 days, hx of similar, just lasting longer, alleviated with ibuprofen but returns, currently no pain.  Denies change in vision, numbness, weakness, or syncope.  States that he was previously taking metformin but stopped this because it upset stomach.  He is not sure if his blood sugar is high.  STD concern- recent tx for trich- he is concerned it didn't work, still has some dysuria. Received 1 x dose tx. Has not had intercourse since. Denies penile discharge, testicular pain, abdominal pain, or pain w/ BM. Marland Kitchen  Review of Systems  Per above.   Physical Exam  BP (!) 148/94   Pulse 81   Resp 16   SpO2 94%  Gen:   Awake, no distress   Resp:  Normal effort  MSK:   Moves extremities without difficulty   Medical Decision Making  Medically screening exam initiated at 2:37 AM.  Appropriate orders placed.  Creed Kail was informed that the remainder of the evaluation will be completed by another provider, this initial triage assessment does not replace that evaluation, and the importance of remaining in the ED until their evaluation is complete.  Headache Concern regarding STD.    Amaryllis Dyke, [place] 11/27/21 [phone]

## 2021-11-28 LAB — GC/CHLAMYDIA PROBE AMP (~~LOC~~) NOT AT ARMC
Chlamydia: NEGATIVE
Comment: NEGATIVE
Comment: NORMAL
Neisseria Gonorrhea: NEGATIVE

## 2022-05-03 ENCOUNTER — Emergency Department (HOSPITAL_COMMUNITY)
Admission: EM | Admit: 2022-05-03 | Discharge: 2022-05-04 | Disposition: A | Discharge status: Home / Self Care | Payer: Managed Care, Other (non HMO) | Attending: Emergency Medicine | Admitting: Emergency Medicine

## 2022-05-03 ENCOUNTER — Encounter (HOSPITAL_COMMUNITY): Payer: Self-pay | Admitting: Emergency Medicine

## 2022-05-03 DIAGNOSIS — R3589 Other polyuria: Secondary | ICD-10-CM | POA: Diagnosis present

## 2022-05-03 DIAGNOSIS — Z7984 Long term (current) use of oral hypoglycemic drugs: Secondary | ICD-10-CM | POA: Diagnosis not present

## 2022-05-03 DIAGNOSIS — R739 Hyperglycemia, unspecified: Secondary | ICD-10-CM

## 2022-05-03 DIAGNOSIS — E1165 Type 2 diabetes mellitus with hyperglycemia: Secondary | ICD-10-CM | POA: Insufficient documentation

## 2022-05-03 LAB — URINALYSIS, ROUTINE W REFLEX MICROSCOPIC
Bacteria, UA: NONE SEEN
Bilirubin Urine: NEGATIVE
Glucose, UA: >=500 mg/dL — AB
Hgb urine dipstick: NEGATIVE
Ketones, ur: 20 mg/dL — AB
Leukocytes,Ua: NEGATIVE
Nitrite: NEGATIVE
Protein, ur: NEGATIVE mg/dL
Specific Gravity, Urine: 1.029 (ref 1.005–1.030)
pH: 5 (ref 5.0–8.0)

## 2022-05-03 LAB — CBC
HCT: 52.5 % — ABNORMAL HIGH (ref 39.0–52.0)
Hemoglobin: 19 g/dL — ABNORMAL HIGH (ref 13.0–17.0)
MCH: 32.1 pg (ref 26.0–34.0)
MCHC: 36.2 g/dL — ABNORMAL HIGH (ref 30.0–36.0)
MCV: 88.7 fL (ref 80.0–100.0)
Platelets: 211 10*3/uL (ref 150–400)
RBC: 5.92 MIL/uL — ABNORMAL HIGH (ref 4.22–5.81)
RDW: 11.6 % (ref 11.5–15.5)
WBC: 8.2 10*3/uL (ref 4.0–10.5)
nRBC: 0 % (ref 0.0–0.2)

## 2022-05-03 LAB — I-STAT VENOUS BLOOD GAS, ED
Acid-Base Excess: 1 mmol/L (ref 0.0–2.0)
Bicarbonate: 28.2 mmol/L — ABNORMAL HIGH (ref 20.0–28.0)
Calcium, Ion: 1.23 mmol/L (ref 1.15–1.40)
HCT: 57 % — ABNORMAL HIGH (ref 39.0–52.0)
Hemoglobin: 19.4 g/dL — ABNORMAL HIGH (ref 13.0–17.0)
O2 Saturation: 32 %
Potassium: 4.3 mmol/L (ref 3.5–5.1)
Sodium: 132 mmol/L — ABNORMAL LOW (ref 135–145)
TCO2: 30 mmol/L (ref 22–32)
pCO2, Ven: 51.2 mmHg (ref 44–60)
pH, Ven: 7.348 (ref 7.25–7.43)
pO2, Ven: 21 mmHg — CL (ref 32–45)

## 2022-05-03 LAB — BASIC METABOLIC PANEL
Anion gap: 14 (ref 5–15)
BUN: 16 mg/dL (ref 6–20)
CO2: 23 mmol/L (ref 22–32)
Calcium: 10.4 mg/dL — ABNORMAL HIGH (ref 8.9–10.3)
Chloride: 93 mmol/L — ABNORMAL LOW (ref 98–111)
Creatinine, Ser: 1.24 mg/dL (ref 0.61–1.24)
GFR, Estimated: >60 mL/min (ref >60)
Glucose, Bld: 544 mg/dL (ref 70–99)
Potassium: 4.6 mmol/L (ref 3.5–5.1)
Sodium: 130 mmol/L — ABNORMAL LOW (ref 135–145)

## 2022-05-03 LAB — CBG MONITORING, ED: Glucose-Capillary: 548 mg/dL (ref 70–99)

## 2022-05-03 MED ORDER — LACTATED RINGERS IV BOLUS
1000.0000 mL | Freq: Once | INTRAVENOUS | Status: AC
  Administered 2022-05-03: 1000 mL via INTRAVENOUS

## 2022-05-03 MED ORDER — INSULIN ASPART 100 UNIT/ML IJ SOLN
10.0000 [IU] | Freq: Once | INTRAMUSCULAR | Status: AC
  Administered 2022-05-03: 10 [IU] via INTRAVENOUS

## 2022-05-03 NOTE — ED Triage Notes (Signed)
Pt here from home with c/o hyperglycemia , frequent urination and increased thirst , pt ran out of his metformin on Tuesday  hasn't checked his Sugar in 2 weeks

## 2022-05-03 NOTE — ED Notes (Signed)
Blood gas results shown to Dr. Dayna Barker.

## 2022-05-04 LAB — CBG MONITORING, ED
Glucose-Capillary: 322 mg/dL — ABNORMAL HIGH (ref 70–99)
Glucose-Capillary: 324 mg/dL — ABNORMAL HIGH (ref 70–99)
Glucose-Capillary: 347 mg/dL — ABNORMAL HIGH (ref 70–99)

## 2022-05-04 MED ORDER — METFORMIN HCL ER 500 MG PO TB24: 1000.0000 mg | ORAL_TABLET | Freq: Two times a day (BID) | ORAL | 1 refills | Status: DC

## 2022-05-04 MED ORDER — BLOOD GLUCOSE MONITOR KIT: PACK | 0 refills | Status: DC

## 2022-05-04 MED ORDER — LACTATED RINGERS IV BOLUS
1000.0000 mL | Freq: Once | INTRAVENOUS | Status: AC
  Administered 2022-05-04: 1000 mL via INTRAVENOUS

## 2022-05-04 MED ORDER — METFORMIN HCL 500 MG PO TABS
500.0000 mg | ORAL_TABLET | Freq: Once | ORAL | Status: AC
  Administered 2022-05-04: 500 mg via ORAL
  Filled 2022-05-04: qty 1

## 2022-05-04 NOTE — ED Provider Notes (Signed)
[facility] Provider Note   CSN: [id number] Arrival date & time: 05/03/22  2204     History  Chief Complaint  Patient presents with   Hyperglycemia    Berle Vollrath is a 32 y.o. male.  H/O diabetes, supposed to be on metformin but been out of if for awhile. No fevers. No diarrhea.some nausea, no emesis. No recent fevers or infections. Has had polyuria and polydipsia.    Hyperglycemia      Home Medications Prior to Admission medications   Medication Sig Start Date End Date Taking? Authorizing Provider  blood glucose meter kit and supplies KIT Dispense based on patient and insurance preference. Use up to four times daily as directed. 05/04/22   Akil Hoos, Corene Cornea, MD  dapagliflozin propanediol (FARXIGA) 5 MG TABS tablet Take 1 tablet (5 mg total) by mouth daily before breakfast. Patient not taking: Reported on 11/27/2021 08/26/21   Camillia Herter, NP  metFORMIN (GLUCOPHAGE-XR) 500 MG 24 hr tablet Take 2 tablets (1,000 mg total) by mouth 2 (two) times daily with a meal. 05/04/22   Carolyne Whitsel, Corene Cornea, MD  diltiazem (CARDIZEM) 30 MG tablet Take 1 tablet (30 mg total) by mouth as needed (for palpitations/ fast heart rates). 06/24/13 12/08/19  Consuelo Pandy, PA-C      Allergies    Patient has no known allergies.    Review of Systems   Review of Systems  Physical Exam Updated Vital Signs BP (!) 125/93   Pulse 63   Temp 98.2 F (36.8 C) (Oral)   Resp 19   SpO2 97%  Physical Exam Vitals and nursing note reviewed.  Constitutional:      Appearance: He is well-developed.  HENT:     Head: Normocephalic and atraumatic.     Mouth/Throat:     Mouth: Mucous membranes are dry.  Eyes:     Pupils: Pupils are equal, round, and reactive to light.  Cardiovascular:     Rate and Rhythm: Normal rate.  Pulmonary:     Effort: Pulmonary effort is normal. No respiratory distress.  Abdominal:     General: Abdomen is flat. There is no distension.   Musculoskeletal:        General: Normal range of motion.     Cervical back: Normal range of motion.  Skin:    General: Skin is warm and dry.  Neurological:     General: No focal deficit present.     Mental Status: He is alert.     ED Results / Procedures / Treatments   Labs (all labs ordered are listed, but only abnormal results are displayed) Labs Reviewed  BASIC METABOLIC PANEL - Abnormal; Notable for the following components:      Result Value   Sodium 130 (*)    Chloride 93 (*)    Glucose, Bld 544 (*)    Calcium 10.4 (*)    All other components within normal limits  CBC - Abnormal; Notable for the following components:   RBC 5.92 (*)    Hemoglobin 19.0 (*)    HCT 52.5 (*)    MCHC 36.2 (*)    All other components within normal limits  URINALYSIS, ROUTINE W REFLEX MICROSCOPIC - Abnormal; Notable for the following components:   Color, Urine STRAW (*)    Glucose, UA >=500 (*)    Ketones, ur 20 (*)    All other components within normal limits  CBG MONITORING, ED - Abnormal; Notable for the following components:  Glucose-Capillary 548 (*)    All other components within normal limits  I-STAT VENOUS BLOOD GAS, ED - Abnormal; Notable for the following components:   pO2, Ven 21 (*)    Bicarbonate 28.2 (*)    Sodium 132 (*)    HCT 57.0 (*)    Hemoglobin 19.4 (*)    All other components within normal limits  CBG MONITORING, ED - Abnormal; Notable for the following components:   Glucose-Capillary 324 (*)    All other components within normal limits  CBG MONITORING, ED - Abnormal; Notable for the following components:   Glucose-Capillary 322 (*)    All other components within normal limits  CBG MONITORING, ED - Abnormal; Notable for the following components:   Glucose-Capillary 347 (*)    All other components within normal limits    EKG None  Radiology No results found.  Procedures Procedures    Medications Ordered in ED Medications  lactated ringers bolus  1,000 mL (0 mLs Intravenous Stopped 05/04/22 0208)  insulin aspart (novoLOG) injection 10 Units (10 Units Intravenous Given 05/03/22 2354)  lactated ringers bolus 1,000 mL (0 mLs Intravenous Stopped 05/04/22 0228)  metFORMIN (GLUCOPHAGE) tablet 500 mg (500 mg Oral Given 05/04/22 0126)    ED Course/ Medical Decision Making/ A&P                             Medical Decision Making Amount and/or Complexity of Data Reviewed Labs: ordered. ECG/medicine tests: ordered.  Risk OTC drugs. Prescription drug management.   Not in DKA. Cbg improved. Refills for metformin/glucometer given.    Final Clinical Impression(s) / ED Diagnoses Final diagnoses:  Hyperglycemia    Rx / DC Orders ED Discharge Orders          Ordered    blood glucose meter kit and supplies KIT        05/04/22 0228    metFORMIN (GLUCOPHAGE-XR) 500 MG 24 hr tablet  2 times daily with meals        05/04/22 0228              Jamelle Noy, Corene Cornea, MD 05/04/22 [id number]

## 2022-05-18 NOTE — Progress Notes (Unsigned)
Patient ID: Ricardo Boyer, male    DOB: 08/31/1990  MRN: [id number]  CC: Emergency Department Follow-Up  Subjective: Ricardo Boyer is a 32 y.o. male who presents for emergency department follow-up.   His concerns today include:  05/03/2022 - 05/04/2022 [facility] Emergency Department at [facility] per MD note: Medical Decision Making Amount and/or Complexity of Data Reviewed Labs: ordered. ECG/medicine tests: ordered.   Risk OTC drugs. Prescription drug management.     Not in DKA. Cbg improved. Refills for metformin/glucometer given.    Today's visit 05/19/2022: Taking Metformin as prescribed. States in the past he never began [place]. Home blood sugars 290's. He is monitoring what he eats. States he is afraid to eat because it causes his blood sugars to spike. He is not exercising. No further issues/concerns for discussion today.    Patient Active Problem List   Diagnosis Date Noted   Abnormal CXR 07/11/2013   Tobacco abuse 07/11/2013   Atrial fibrillation ([place]) 06/23/2013     Current Outpatient Medications on File Prior to Visit  Medication Sig Dispense Refill   blood glucose meter kit and supplies KIT Dispense based on patient and insurance preference. Use up to four times daily as directed. 1 each 0   dapagliflozin propanediol (FARXIGA) 5 MG TABS tablet Take 1 tablet (5 mg total) by mouth daily before breakfast. (Patient not taking: Reported on 11/27/2021) 30 tablet 0   metFORMIN (GLUCOPHAGE-XR) 500 MG 24 hr tablet Take 2 tablets (1,000 mg total) by mouth 2 (two) times daily with a meal. 120 tablet 1   [DISCONTINUED] diltiazem (CARDIZEM) 30 MG tablet Take 1 tablet (30 mg total) by mouth as needed (for palpitations/ fast heart rates). 30 tablet 3   No current facility-administered medications on file prior to visit.    No Known Allergies  Social History   Socioeconomic History   Marital status: Single    Spouse name: Not on file   Number of children:  2   Years of education: Not on file   Highest education level: Not on file  Occupational History   Occupation: WASTE MANAGEMENT    Employer: [employer]  Tobacco Use   Smoking status: Heavy Smoker    Packs/day: 0.50    Years: 2.00    Total pack years: 1.00    Types: Cigarettes    Passive exposure: Current   Smokeless tobacco: Never  Vaping Use   Vaping Use: Never used  Substance and Sexual Activity   Alcohol use: Never   Drug use: No   Sexual activity: Yes  Other Topics Concern   Not on file  Social History Narrative   Not on file   Social Determinants of Health   Financial Resource Strain: Not on file  Food Insecurity: Not on file  Transportation Needs: Not on file  Physical Activity: Not on file  Stress: Not on file  Social Connections: Not on file  Intimate Partner Violence: Not on file    Family History  Problem Relation Age of Onset   Diabetes Mother     No past surgical history on file.  ROS: Review of Systems Negative except as stated above  PHYSICAL EXAM: Temp 98.5 F (36.9 C)   Resp 16   Ht 5' 8.86" (1.749 m)   BMI 28.77 kg/m   Physical Exam HENT:     Head: Normocephalic and atraumatic.  Eyes:     Extraocular Movements: Extraocular movements intact.     Conjunctiva/sclera: Conjunctivae normal.  Pupils: Pupils are equal, round, and reactive to light.  Cardiovascular:     Rate and Rhythm: Normal rate and regular rhythm.     Pulses: Normal pulses.     Heart sounds: Normal heart sounds.  Pulmonary:     Effort: Pulmonary effort is normal.     Breath sounds: Normal breath sounds.  Musculoskeletal:     Cervical back: Normal range of motion and neck supple.  Neurological:     General: No focal deficit present.     Mental Status: He is alert and oriented to person, place, and time.  Psychiatric:        Mood and Affect: Mood normal.        Behavior: Behavior normal.    Results for orders placed or performed in visit on 05/19/22   POCT glycosylated hemoglobin (Hb A1C)  Result Value Ref Range   Hemoglobin A1C 13.2 (A) 4.0 - 5.6 %   HbA1c POC (<> result, manual entry)     HbA1c, POC (prediabetic range)     HbA1c, POC (controlled diabetic range)       ASSESSMENT AND PLAN: 1. Type 2 diabetes mellitus without complication, without long-term current use of insulin (HCC) - Hemoglobin A1c not at goal at 13.2%, goal 7%. This is increased compared to previous 9.0%.  - Continue Metformin as prescribed. No refills needed as of present.  - Begin Insulin Glargine as prescribed. Counseled on medication adherence.  - Discussed the importance of healthy eating habits, low-carbohydrate diet, low-sugar diet, regular aerobic exercise (at least 150 minutes a week as tolerated) and medication compliance to achieve or maintain control of diabetes. - Follow-up with clinical pharmacist in 4 weeks for diabetes checkup. Write your home blood sugar results down each day and bring those results to your appointment along with your home glucose monitor. Medications may be revised at that time if needed. - Referral to Medical Nutrition Therapy for further evaluation/management.  - Follow-up with primary provider as scheduled.  - POCT glycosylated hemoglobin (Hb A1C) - Insulin Glargine (BASAGLAR KWIKPEN) 100 UNIT/ML; Inject 10 Units into the skin at bedtime.  Dispense: 3 mL; Refill: 1 - Insulin Pen Needle (PEN NEEDLES) 31G X 8 MM MISC; UAD  Dispense: 100 each; Refill: 0 - Amb ref to Medical Nutrition Therapy-MNT  Patient was given the opportunity to ask questions.  Patient verbalized understanding of the plan and was able to repeat key elements of the plan. Patient was given clear instructions to go to Emergency Department or return to medical center if symptoms don't improve, worsen, or new problems develop.The patient verbalized understanding.   Orders Placed This Encounter  Procedures   Amb ref to Medical Nutrition Therapy-MNT   POCT  glycosylated hemoglobin (Hb A1C)     Requested Prescriptions   Signed Prescriptions Disp Refills   Insulin Glargine (BASAGLAR KWIKPEN) 100 UNIT/ML 3 mL 1    Sig: Inject 10 Units into the skin at bedtime.   Insulin Pen Needle (PEN NEEDLES) 31G X 8 MM MISC 100 each 0    Sig: UAD    Return in about 4 weeks (around 06/16/2022) for Follow-Up or next available [place], RPH-CPP .  Camillia Herter, NP

## 2022-05-19 ENCOUNTER — Ambulatory Visit (INDEPENDENT_AMBULATORY_CARE_PROVIDER_SITE_OTHER): Payer: Managed Care, Other (non HMO) | Admitting: Family

## 2022-05-19 VITALS — Temp 98.5°F | Resp 16 | Ht 68.86 in

## 2022-05-19 DIAGNOSIS — F17210 Nicotine dependence, cigarettes, uncomplicated: Secondary | ICD-10-CM | POA: Diagnosis not present

## 2022-05-19 DIAGNOSIS — E119 Type 2 diabetes mellitus without complications: Secondary | ICD-10-CM | POA: Diagnosis not present

## 2022-05-19 LAB — POCT GLYCOSYLATED HEMOGLOBIN (HGB A1C): Hemoglobin A1C: 13.2 % — AB (ref 4.0–5.6)

## 2022-05-19 MED ORDER — BASAGLAR KWIKPEN 100 UNIT/ML ~~LOC~~ SOPN: 10.0000 [IU] | PEN_INJECTOR | Freq: Every day | SUBCUTANEOUS | 1 refills | Status: DC

## 2022-05-19 MED ORDER — BD PEN NEEDLE SHORT U/F 31G X 8 MM MISC: 0 refills | Status: DC

## 2022-05-19 MED ORDER — PEN NEEDLES 31G X 8 MM MISC: 0 refills | Status: DC

## 2022-05-19 NOTE — Addendum Note (Signed)
Addended by: Elmon Else on: 05/19/2022 04:31 PM   Modules accepted: Orders

## 2022-05-19 NOTE — Progress Notes (Signed)
Pt presents for follow-up -urgent care visit on 05/04/22  -pt states that he can't keep his sugars down  -has not taken [place] since prescribed by PCP

## 2022-06-10 NOTE — Progress Notes (Addendum)
Patient ID: Ricardo Boyer, male    DOB: 02/23/1991  MRN: [id number]  CC: Follow-up   Subjective: Ricardo Boyer is a 32 y.o. male who presents for follow-up.   His concerns today include:  Since last visit doing well on Metformin and Insulin. He reports he is taking the same as prescribed. He is not monitoring what he eats. He does not exercise outside of his normal routine. Reports home blood sugars running "high". He recalls a recent blood sugars in the 500's. He is urinating more frequently. He denies additional symptoms. No further issues/concerns for discussion today.    Patient Active Problem List   Diagnosis Date Noted   Abnormal CXR 07/11/2013   Tobacco abuse 07/11/2013   Atrial fibrillation 06/23/2013     Current Outpatient Medications on File Prior to Visit  Medication Sig Dispense Refill   blood glucose meter kit and supplies KIT Dispense based on patient and insurance preference. Use up to four times daily as directed. 1 each 0   Insulin Glargine (BASAGLAR KWIKPEN) 100 UNIT/ML Inject 10 Units into the skin at bedtime. 3 mL 1   Insulin Pen Needle (B-D ULTRAFINE III SHORT PEN) 31G X 8 MM MISC UAD 100 each 0   metFORMIN (GLUCOPHAGE-XR) 500 MG 24 hr tablet Take 2 tablets (1,000 mg total) by mouth 2 (two) times daily with a meal. 120 tablet 1   [DISCONTINUED] diltiazem (CARDIZEM) 30 MG tablet Take 1 tablet (30 mg total) by mouth as needed (for palpitations/ fast heart rates). 30 tablet 3   No current facility-administered medications on file prior to visit.    No Known Allergies  Social History   Socioeconomic History   Marital status: Single    Spouse name: Not on file   Number of children: 2   Years of education: Not on file   Highest education level: Not on file  Occupational History   Occupation: WASTE MANAGEMENT    Employer: [employer]  Tobacco Use   Smoking status: Heavy Smoker    Packs/day: 0.50    Years: 2.00    Additional pack years: 0.00     Total pack years: 1.00    Types: Cigarettes    Passive exposure: Current   Smokeless tobacco: Never  Vaping Use   Vaping Use: Never used  Substance and Sexual Activity   Alcohol use: Never   Drug use: No   Sexual activity: Yes  Other Topics Concern   Not on file  Social History Narrative   Not on file   Social Determinants of Health   Financial Resource Strain: Not on file  Food Insecurity: Not on file  Transportation Needs: Not on file  Physical Activity: Not on file  Stress: Not on file  Social Connections: Not on file  Intimate Partner Violence: Not on file    Family History  Problem Relation Age of Onset   Diabetes Mother     No past surgical history on file.  ROS: Review of Systems Negative except as stated above  PHYSICAL EXAM: BP 139/84 (BP Location: Right Arm, Patient Position: Sitting, Cuff Size: Normal)   Pulse 77   Temp 98.1 F (36.7 C)   Resp 14   Ht  (1.727 m)   Wt 190 lb 6.4 oz (86.4 kg)   SpO2 99%   BMI 28.95 kg/m   Physical Exam HENT:     Head: Normocephalic and atraumatic.  Eyes:     Extraocular Movements: Extraocular movements intact.  Conjunctiva/sclera: Conjunctivae normal.     Pupils: Pupils are equal, round, and reactive to light.  Cardiovascular:     Rate and Rhythm: Normal rate and regular rhythm.     Pulses: Normal pulses.     Heart sounds: Normal heart sounds.  Pulmonary:     Effort: Pulmonary effort is normal.     Breath sounds: Normal breath sounds.  Musculoskeletal:     Cervical back: Normal range of motion and neck supple.  Neurological:     General: No focal deficit present.     Mental Status: He is alert and oriented to person, place, and time.  Psychiatric:        Mood and Affect: Mood normal.        Behavior: Behavior normal.    Results for orders placed or performed in visit on 06/19/22  POCT glycosylated hemoglobin (Hb A1C)  Result Value Ref Range   Hemoglobin A1C     HbA1c POC (<> result, manual  entry)     HbA1c, POC (prediabetic range)     HbA1c, POC (controlled diabetic range) 13.0 (A) 0.0 - 7.0 %     ASSESSMENT AND PLAN: Uncontrolled type 2  diabetes without complication, with long-term current use of insulin - Hemoglobin A1c not at goal at 13.0%, goal 7.0%. This is similar to previous 13.2%.  - Continue Metformin as prescribed.  - Increase Insulin Glargine from 10 units daily to 15 units daily x 7 days and then 18 units continuing.  - Discussed the importance of healthy eating habits, low-carbohydrate diet, low-sugar diet, regular aerobic exercise (at least 150 minutes a week as tolerated) and medication compliance to achieve or maintain control of diabetes. - Referral to Endocrinology for further evaluation/management. During the interim follow-up with primary provider as scheduled.    Patient was given the opportunity to ask questions.  Patient verbalized understanding of the plan and was able to repeat key elements of the plan. Patient was given clear instructions to go to Emergency Department or return to medical center if symptoms don't improve, worsen, or new problems develop.The patient verbalized understanding.  Follow-up with primary provider as scheduled.   Ricardo Fendt, NP

## 2022-06-19 ENCOUNTER — Other Ambulatory Visit: Payer: Self-pay | Admitting: Family

## 2022-06-19 ENCOUNTER — Encounter: Payer: Self-pay | Admitting: Family

## 2022-06-19 ENCOUNTER — Encounter: Payer: Managed Care, Other (non HMO) | Admitting: Family

## 2022-06-19 ENCOUNTER — Telehealth: Payer: Self-pay | Admitting: Family

## 2022-06-19 DIAGNOSIS — E1165 Type 2 diabetes mellitus with hyperglycemia: Secondary | ICD-10-CM

## 2022-06-19 DIAGNOSIS — E119 Type 2 diabetes mellitus without complications: Secondary | ICD-10-CM

## 2022-06-19 LAB — POCT GLYCOSYLATED HEMOGLOBIN (HGB A1C): HbA1c, POC (controlled diabetic range): 13 % — AB (ref 0.0–7.0)

## 2022-06-19 MED ORDER — BASAGLAR KWIKPEN 100 UNIT/ML ~~LOC~~ SOPN: PEN_INJECTOR | SUBCUTANEOUS | 0 refills | Status: DC

## 2022-06-19 MED ORDER — METFORMIN HCL ER 500 MG PO TB24: 1000.0000 mg | ORAL_TABLET | Freq: Two times a day (BID) | ORAL | 2 refills | Status: DC

## 2022-06-19 NOTE — Addendum Note (Signed)
Addended by: Marcelle Overlie on: 06/19/2022 10:11 AM   Modules accepted: Orders

## 2022-06-19 NOTE — Progress Notes (Signed)
Pt is here for DM f/u   States his sugar has been reading very high for the past month  Urinating more frequently

## 2022-11-12 NOTE — Telephone Encounter (Signed)
See routing comment(s) for message information.

## 2022-12-14 ENCOUNTER — Other Ambulatory Visit: Payer: Self-pay

## 2022-12-14 ENCOUNTER — Emergency Department (HOSPITAL_COMMUNITY)
Admission: EM | Admit: 2022-12-14 | Discharge: 2022-12-15 | Disposition: A | Discharge status: Home / Self Care | Payer: Managed Care, Other (non HMO) | Attending: Emergency Medicine | Admitting: Emergency Medicine

## 2022-12-14 ENCOUNTER — Encounter (HOSPITAL_COMMUNITY): Payer: Self-pay

## 2022-12-14 DIAGNOSIS — Z7984 Long term (current) use of oral hypoglycemic drugs: Secondary | ICD-10-CM | POA: Insufficient documentation

## 2022-12-14 DIAGNOSIS — R519 Headache, unspecified: Secondary | ICD-10-CM | POA: Insufficient documentation

## 2022-12-14 DIAGNOSIS — Z794 Long term (current) use of insulin: Secondary | ICD-10-CM | POA: Diagnosis not present

## 2022-12-14 DIAGNOSIS — E119 Type 2 diabetes mellitus without complications: Secondary | ICD-10-CM | POA: Insufficient documentation

## 2022-12-14 NOTE — ED Triage Notes (Signed)
Pt complaining of a headache behind the eyes that started yesterday. Is not relieved with ibuprofen. Not affecting his eyesight at all.

## 2022-12-15 MED ORDER — KETOROLAC TROMETHAMINE 30 MG/ML IJ SOLN
15.0000 mg | Freq: Once | INTRAMUSCULAR | Status: AC
  Administered 2022-12-15: 15 mg via INTRAMUSCULAR
  Filled 2022-12-15: qty 1

## 2022-12-15 MED ORDER — METOCLOPRAMIDE HCL 5 MG/ML IJ SOLN
10.0000 mg | Freq: Once | INTRAMUSCULAR | Status: AC
  Administered 2022-12-15: 10 mg via INTRAMUSCULAR
  Filled 2022-12-15: qty 2

## 2022-12-15 NOTE — Discharge Instructions (Signed)
You were evaluated today for headache which improved with medicines.  Please take acetaminophen and ibuprofen at home as needed for continued headache management.  Follow-up as needed with your primary care provider.  If you develop any life-threatening symptoms please return to the emergency department.

## 2022-12-15 NOTE — ED Provider Notes (Signed)
[place] EMERGENCY DEPARTMENT AT [facility] Provider Note   CSN: [id number] Arrival date & time: 12/14/22  2059     History  Chief Complaint  Patient presents with   Headache    Ricardo Boyer is a 32 y.o. male.  Patient presents to the Emergency Department complaining of 2 days of headache described as feeling like a pressure behind his eyes, worse in the right side of his head.  He states he has tried ibuprofen at home with no relief.  The patient reports that frequently these headaches occur when his glucose gets low but he has checked his glucose at home and has been within normal ranges throughout the headache.  He denies any vision changes, dizziness, nausea, vomiting.  Past medical history significant for type II DM, history of A-fib   Headache      Home Medications Prior to Admission medications   Medication Sig Start Date End Date Taking? Authorizing Provider  blood glucose meter kit and supplies KIT Dispense based on patient and insurance preference. Use up to four times daily as directed. 05/04/22   Mesner, Barbara Cower, MD  Insulin Glargine (BASAGLAR KWIKPEN) 100 UNIT/ML Inject 15 Units into the skin at bedtime for 7 days, THEN 18 Units at bedtime. 06/19/22 09/12/22  Rema Fendt, NP  Insulin Pen Needle (B-D ULTRAFINE III SHORT PEN) 31G X 8 MM MISC UAD 05/19/22   Rema Fendt, NP  metFORMIN (GLUCOPHAGE-XR) 500 MG 24 hr tablet Take 2 tablets (1,000 mg total) by mouth 2 (two) times daily with a meal. 06/19/22   Rema Fendt, NP  diltiazem (CARDIZEM) 30 MG tablet Take 1 tablet (30 mg total) by mouth as needed (for palpitations/ fast heart rates). 06/24/13 12/08/19  Allayne Butcher, PA-C      Allergies    Patient has no known allergies.    Review of Systems   Review of Systems  Neurological:  Positive for headaches.    Physical Exam Updated Vital Signs BP (!) 142/100   Pulse 72   Temp 98.6 F (37 C)   Resp 17   Ht 5\' 8"  (1.727 m)   Wt 86.2 kg    SpO2 96%   BMI 28.89 kg/m  Physical Exam HENT:     Head: Normocephalic and atraumatic.  Eyes:     Extraocular Movements: Extraocular movements intact.     Right eye: No nystagmus.     Left eye: No nystagmus.     Pupils: Pupils are equal, round, and reactive to light.  Pulmonary:     Effort: Pulmonary effort is normal. No respiratory distress.  Musculoskeletal:        General: No signs of injury.     Cervical back: Normal range of motion and neck supple. No rigidity.  Skin:    General: Skin is dry.  Neurological:     Mental Status: He is alert and oriented to person, place, and time.  Psychiatric:        Speech: Speech normal.        Behavior: Behavior normal.     ED Results / Procedures / Treatments   Labs (all labs ordered are listed, but only abnormal results are displayed) Labs Reviewed - No data to display  EKG None  Radiology No results found.  Procedures Procedures    Medications Ordered in ED Medications  ketorolac (TORADOL) 30 MG/ML injection 15 mg (15 mg Intramuscular Given 12/15/22 0438)  metoCLOPramide (REGLAN) injection 10 mg (10 mg Intramuscular  Given 12/15/22 0439)    ED Course/ Medical Decision Making/ A&P                                 Medical Decision Making Risk Prescription drug management.   This patient presents to the ED for concern of headache, this involves an extensive number of treatment options, and is a complaint that carries with it a high risk of complications and morbidity.  The differential diagnosis includes tension headache, migraine, other headache disorder, meningitis, others   Co morbidities that complicate the patient evaluation  Type II DM   Additional history obtained:   External records from outside source obtained and reviewed including family medicine notes showing management of diabetes  Imaging Studies ordered:  No red flag symptoms to necessitate intracranial imaging at this time   Problem List / ED  Course / Critical interventions / Medication management   I ordered medication including Reglan and Toradol for headache Reevaluation of the patient after these medicines showed that the patient improved I have reviewed the patients home medicines and have made adjustments as needed   Test / Admission - Considered:  Patient's headache improved significantly after medication administration.  Plan to discharge home at this time.  No red flag symptoms to suggest intracranial abnormality.  No rigidity in the neck to suggest meningitis.         Final Clinical Impression(s) / ED Diagnoses Final diagnoses:  Bad headache    Rx / DC Orders ED Discharge Orders     None         Pamala Duffel 12/15/22 0604    Sabas Sous, MD 12/15/22 [phone]

## 2023-03-17 ENCOUNTER — Encounter: Payer: Managed Care, Other (non HMO) | Attending: Family | Admitting: Nutrition

## 2023-03-17 DIAGNOSIS — E119 Type 2 diabetes mellitus without complications: Secondary | ICD-10-CM | POA: Diagnosis present

## 2023-03-17 NOTE — Progress Notes (Signed)
 Patient is here today to review his diet and said he is open to making some changes to his diet, but not sure how much he is willing to commit to. Diabetes Medications:   patient stopped insulin  X4 weeks.  On no other medications. Exercise:  none  Works night shift 9PM to 6AM.  Active at work.   SBGM:  was given a libre 3 but it ran out several days ago.  Has a meter.  Says FBSs 110.  Not testing pc, and did not bring his meter.  Says does not like to prick his finger Diet: Patient says he has reduced meal size and snacking.  Says his diet is terrible.  typical day:  7AM:  home from [employer]  Denies eating or drinking anything by water .  Takes kids to school 9AM:  Sleeps until 3PM  up goes to [employer] and gets 10 fried chicken wings and fries.  Drinks Gatorade Zero 6PM-7:30 PM: sleeps  9PM: work.  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  Has 15 minute break at 11, 1AM, and 3:30 has 30 minute break.  Will eat chips or something from machine.  Goes outside to sit.  Says eats only snacks.  Has [employer] 1-3X/wk. Discussion:  Need for fruits and vegetables in diet, or needs multivitamin.  Need to stop drinks sweet drinks Need sensor to monitor blood sugar readings.  I am hoping this will allow him to see pc readings and possible need for insulin .  Herlene 3 sensor given: lot number: [id number]  Exp.07/07/23 Why he should be taking his insulin .  Discussed how this insulin  works and why he is taking this-to bring down FBS and rest pancrease to allow it to provide insulin  for meal time coverage.  Hoping he can see this with sensor. Need for balanced meal having all 3 food groups-carbs,protein fat,- what foods fall into each group and how much of each he should be eating.   Need for exercise:  how this decreases insulin  resistance and helps his insulin  to work better-strive for 30 min. 4-5 days/wk. Believe overall understanding is good, and motivation is good at present time.  Will call him next week and  review blood sugar readings and diet changes.

## 2023-03-21 NOTE — Patient Instructions (Signed)
 Stop sweet drinks, cold cereal and milk and fruit juices Goal of blood sugar readings:  before meals: less than 110, and 2hr. After: less than 170.   Exercise for 30 minutes 4-5 days/wk. Have carbohydrate, protein and small amount of fat at each meal Call me in one week for blood sugar review

## 2023-07-31 IMAGING — DX DG CHEST 2V
2 series · 2 of 2 positions shown · non-contrast
Comparison: Chest radiograph 07/11/2013

CLINICAL DATA: Cough and fever.

EXAM:
CHEST - 2 VIEW

[chest pa]
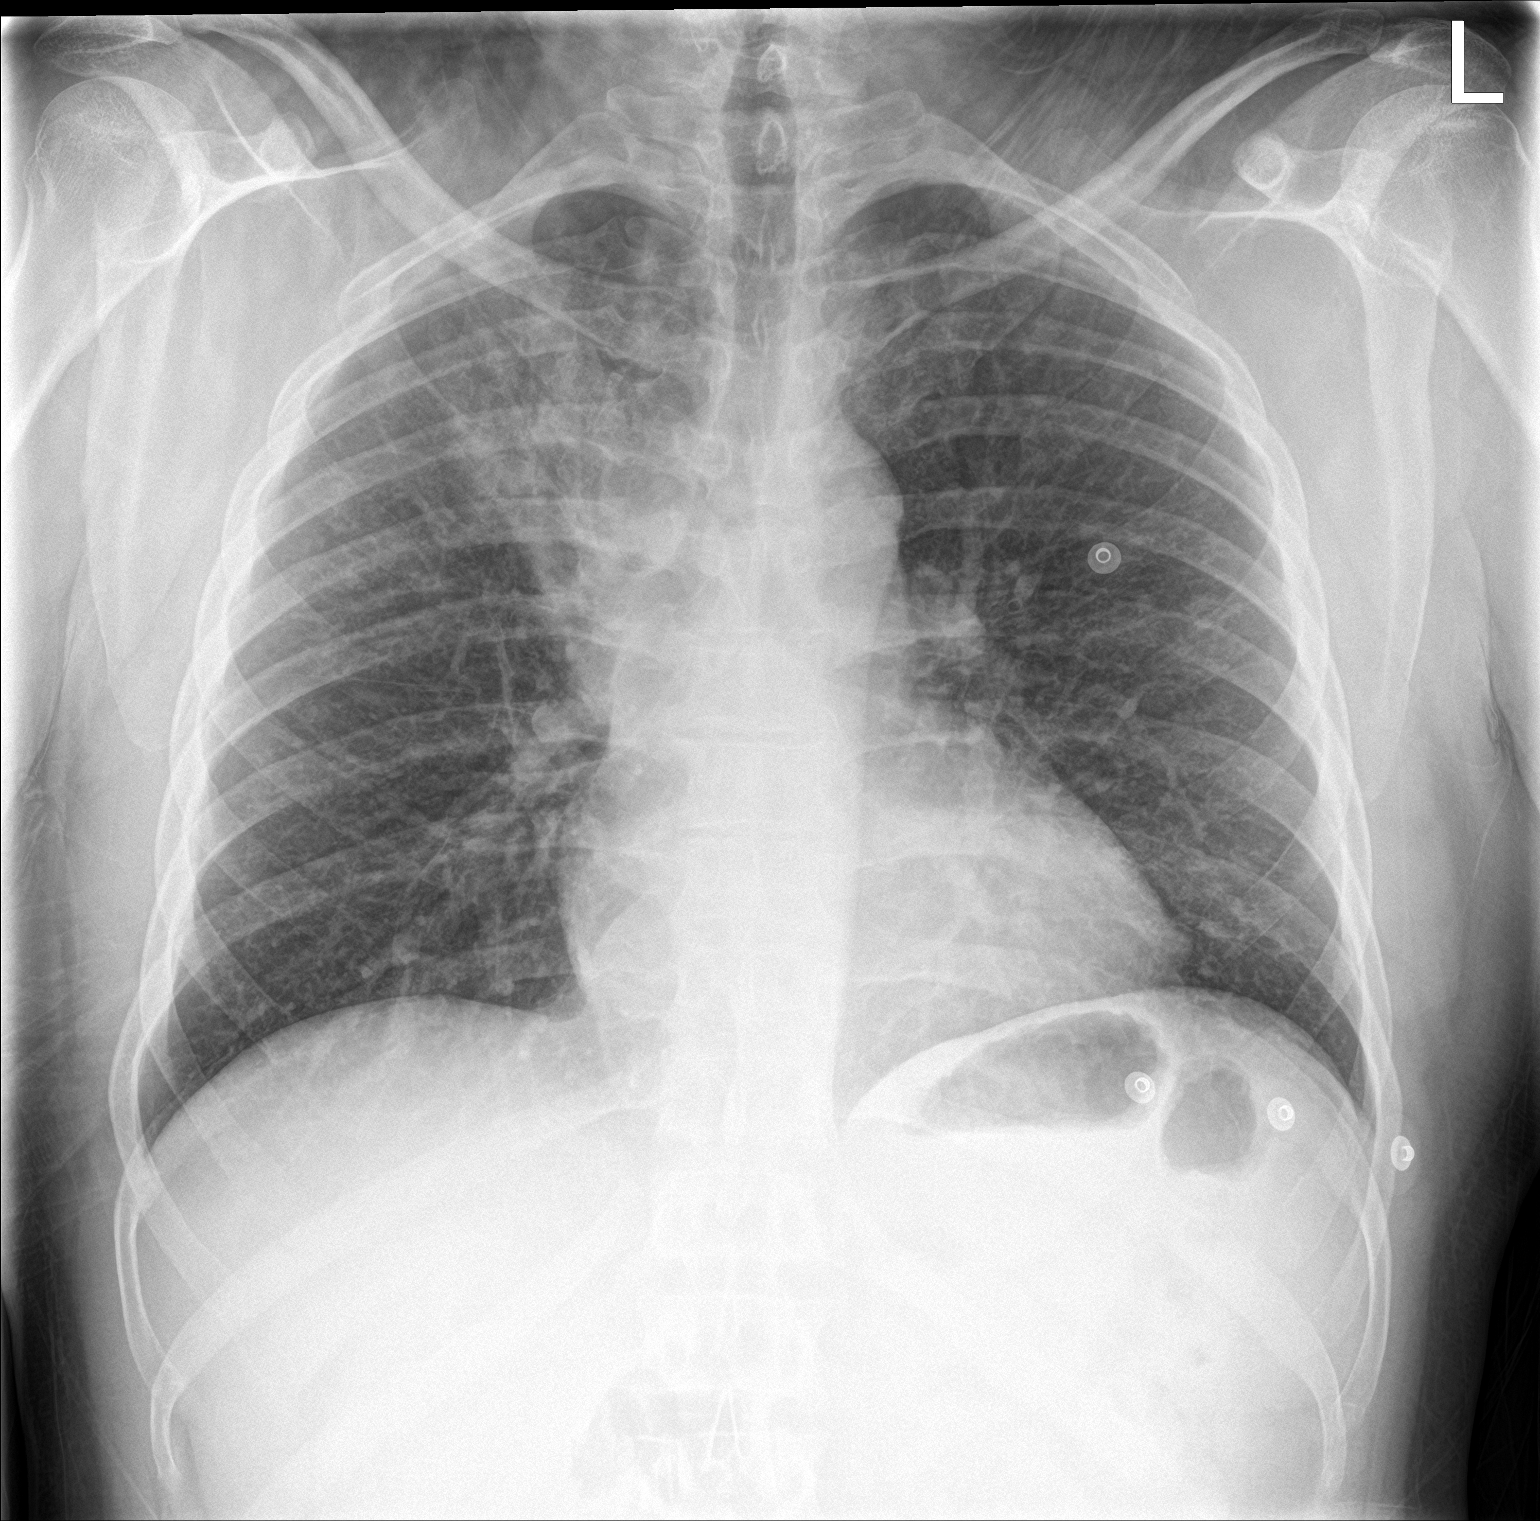

[chest lat]
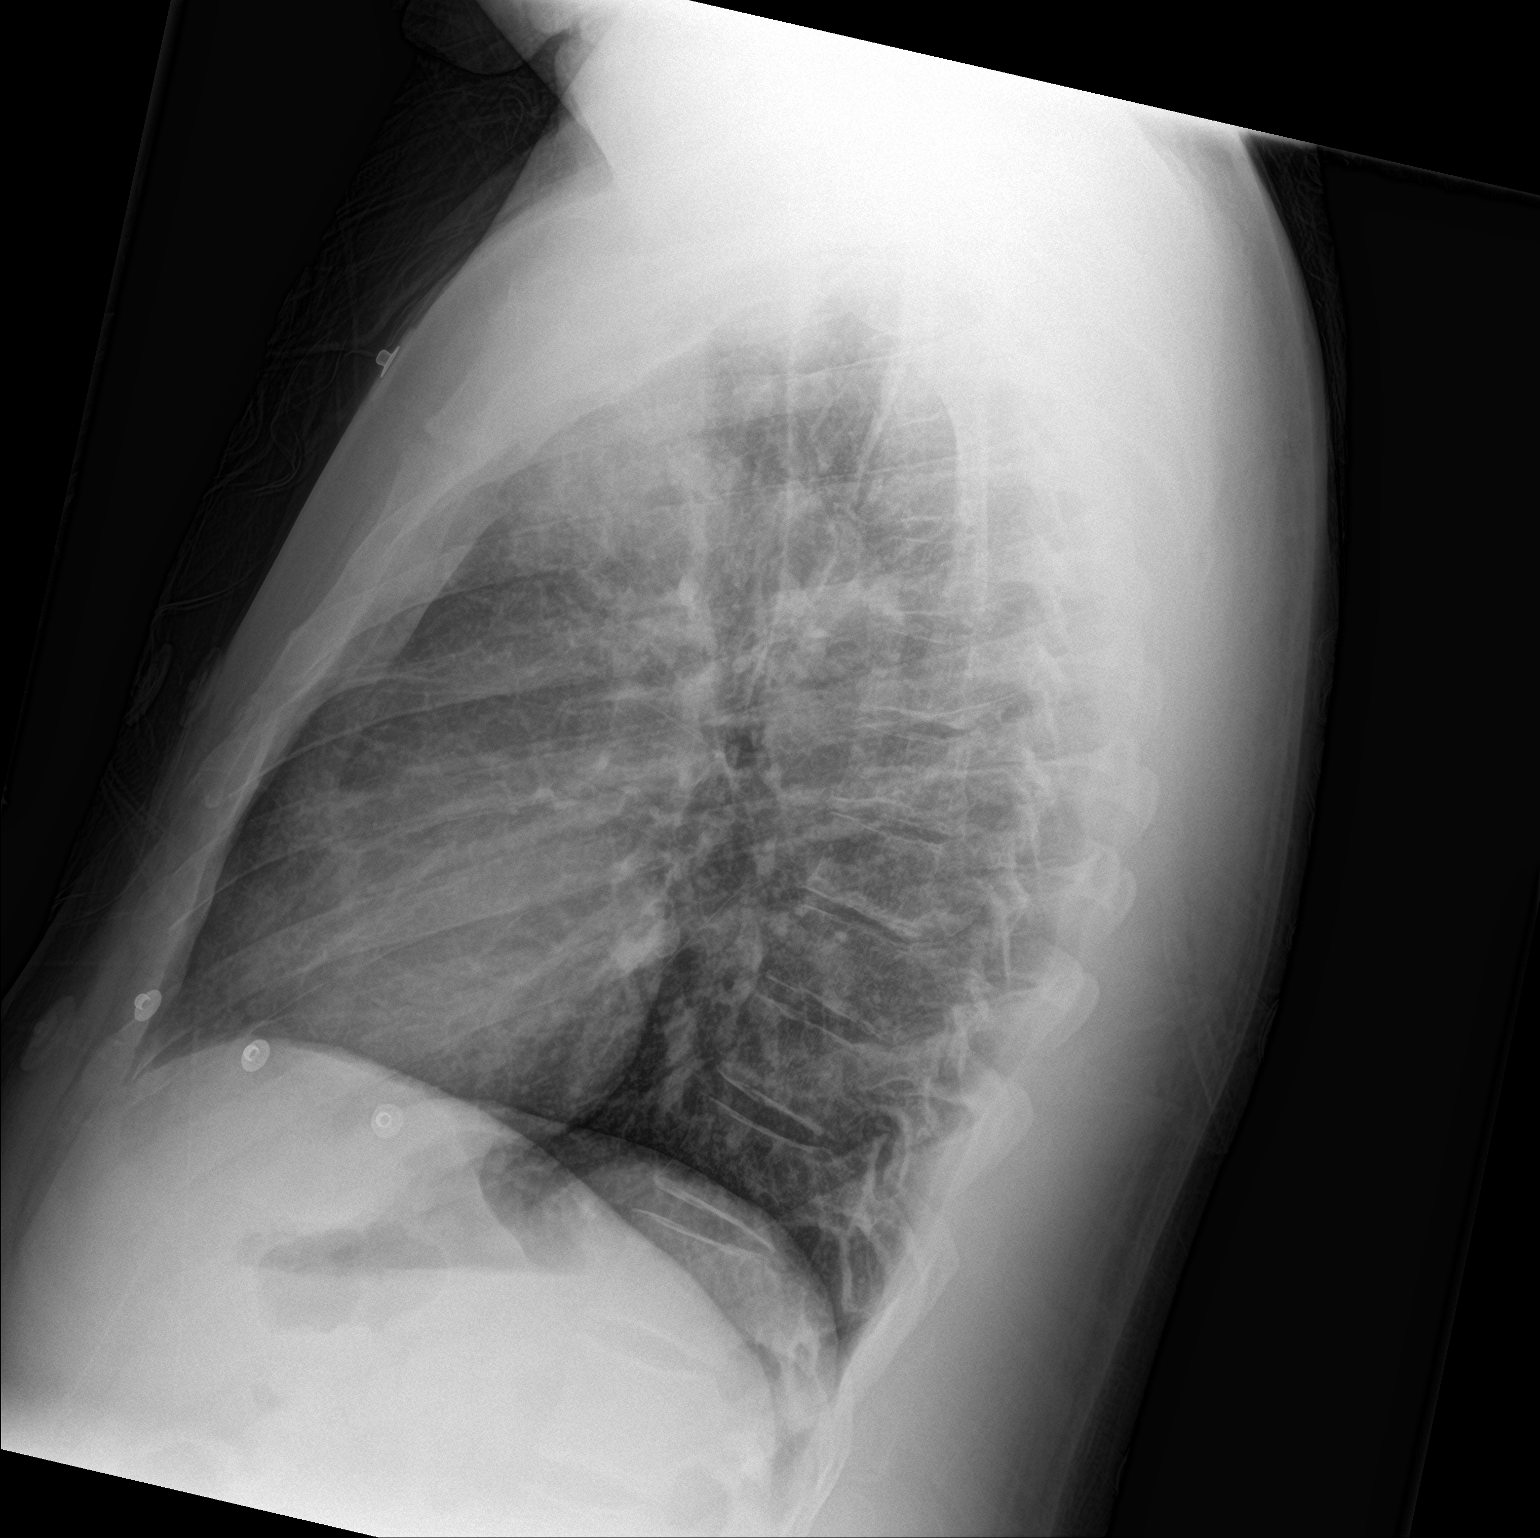

[2 of 2 positions shown; findings below may reference images not displayed]

FINDINGS: Consolidation in the perihilar right upper lobe. There may be
minimal ill-defined vague opacity in the periphery of the left mid
lower lung zone. The heart is normal in size. No pleural effusion or
pneumothorax. No acute osseous abnormalities are seen.
IMPRESSION: Right upper lobe pneumonia. Possible minimal ill-defined opacity in
the left mid lower lung zone may represent additional site of
infection.

## 2023-08-02 IMAGING — CR DG CHEST 2V
2 series · 2 of 2 positions shown · non-contrast
Comparison: 01/08/2021

CLINICAL DATA: Cough and fever.  Nausea and vomiting.  Pneumonia.

EXAM:
CHEST - 2 VIEW

[chest lat]
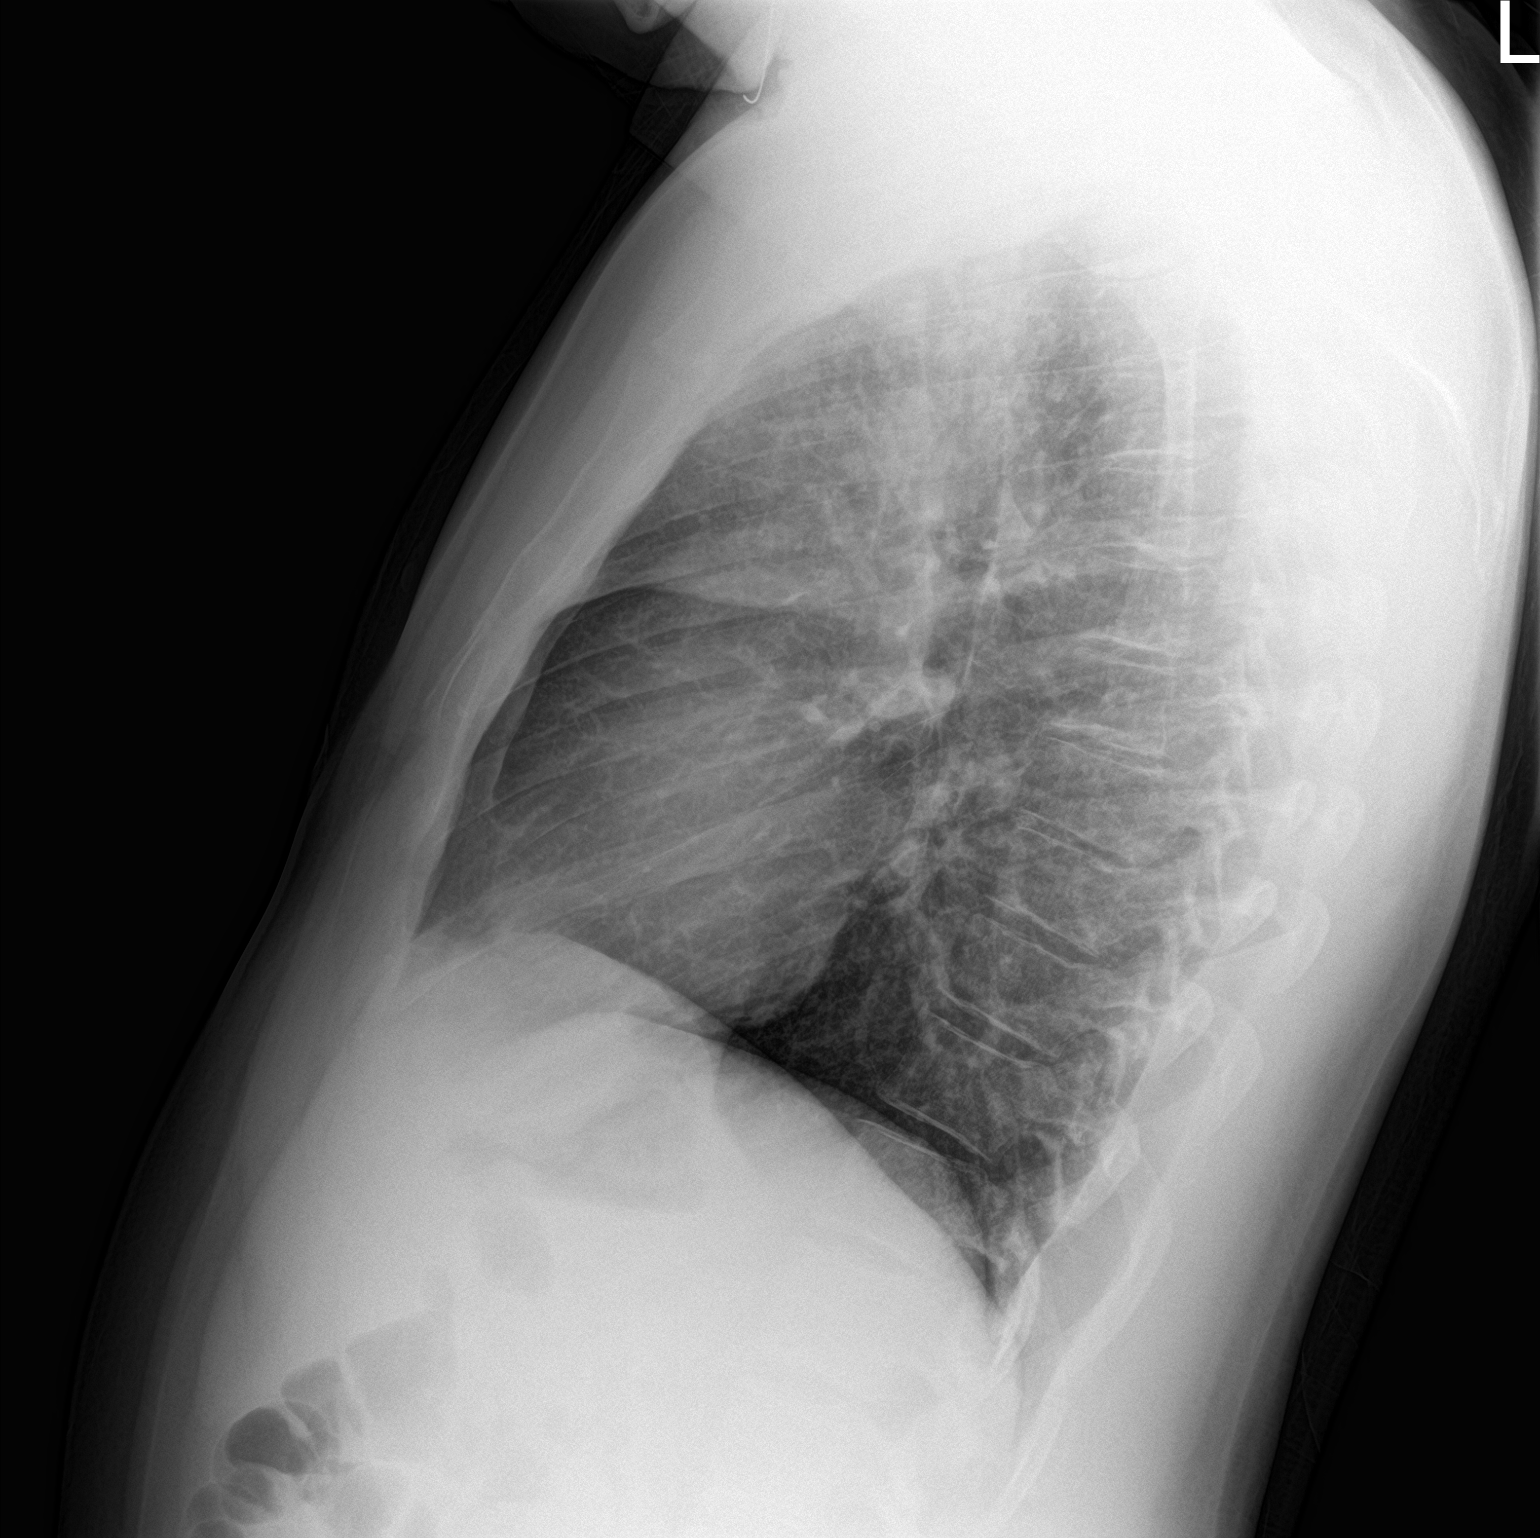

[chest pa]
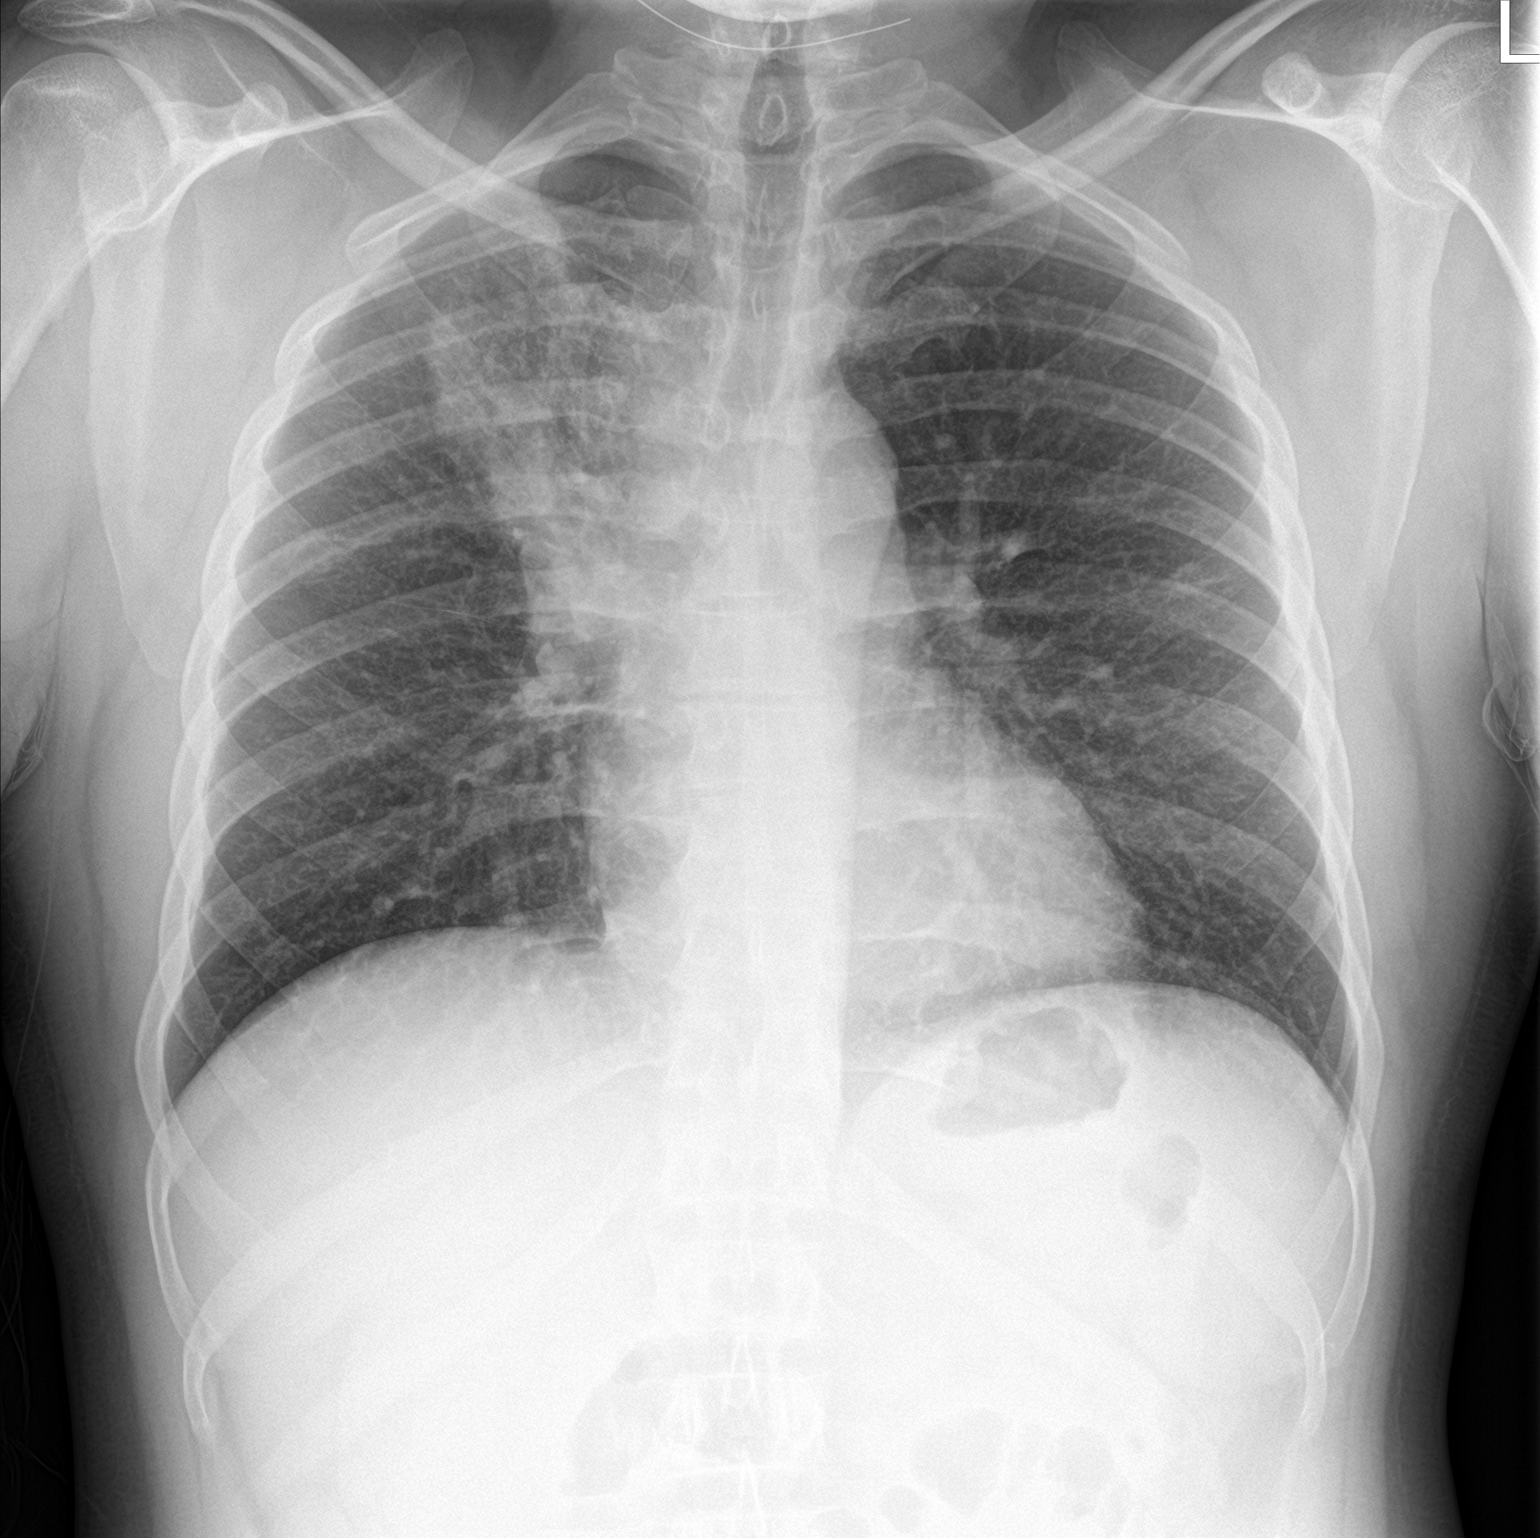

[2 of 2 positions shown; findings below may reference images not displayed]

FINDINGS: Heart size is normal. Right upper lobe airspace disease shows no
significant change, suspicious for pneumonia. Left lung is clear. No
evidence of pleural effusion.
IMPRESSION: Stable right upper lobe airspace disease, suspicious for pneumonia.

## 2023-08-03 IMAGING — CT CT ANGIO CHEST
2 of 7 series · 17 of 46 positions shown · IV contrast (APPLIED)
Comparison: Chest radiograph dated 01/10/2021.

CLINICAL DATA: Concern for pulmonary embolism.

EXAM:
CT ANGIOGRAPHY CHEST WITH CONTRAST
TECHNIQUE: Multidetector CT imaging of the chest was performed using the
standard protocol during bolus administration of intravenous
contrast. Multiplanar CT image reconstructions and MIPs were
obtained to evaluate the vascular anatomy.
CONTRAST:  80mL OMNIPAQUE IOHEXOL 350 MG/ML SOLN

[Series 7: thins · axial · 0.79mm/px · z∈[-3,+270]mm · 14 of 439 slices shown]
[im 25/439  lung]
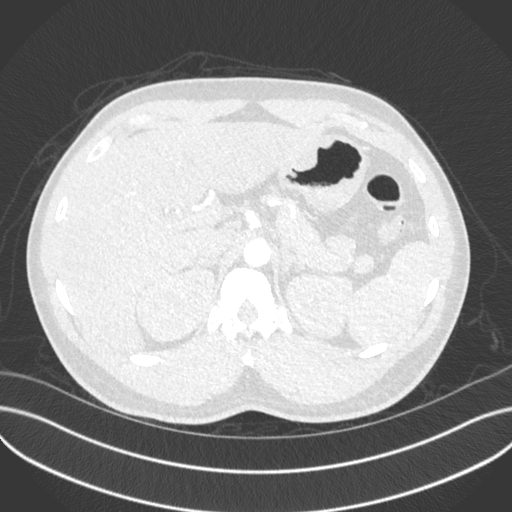
[im 49/439  soft-tissue]
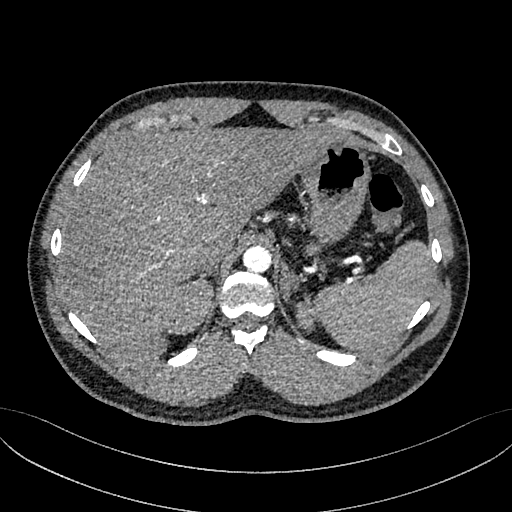
[im 98/439  lung]
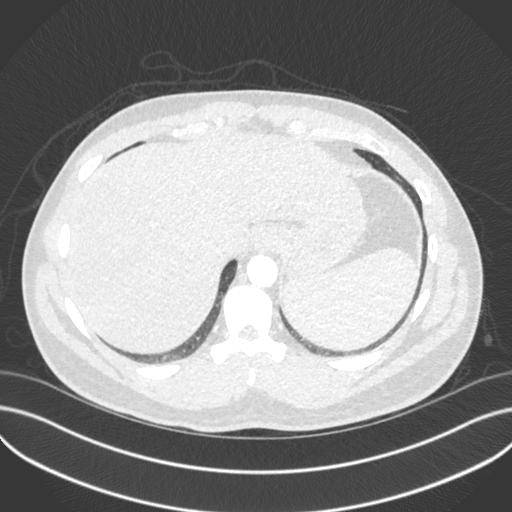
[im 122/439  soft-tissue]
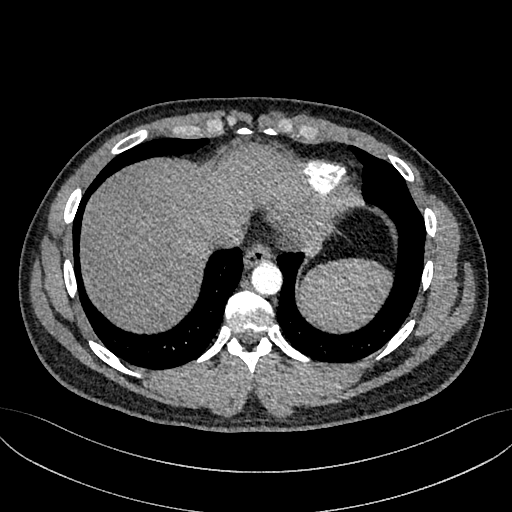
[im 147/439  lung]
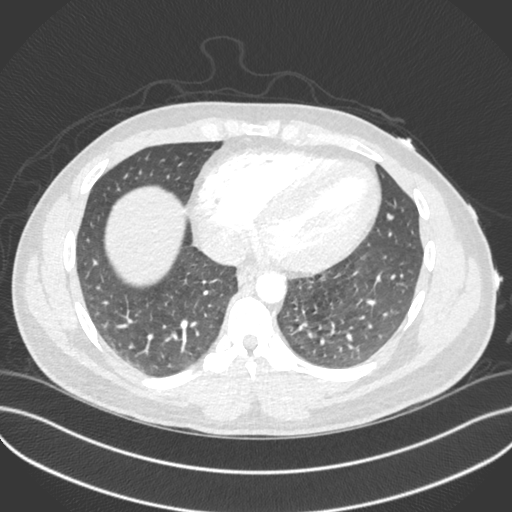
[im 171/439  soft-tissue]
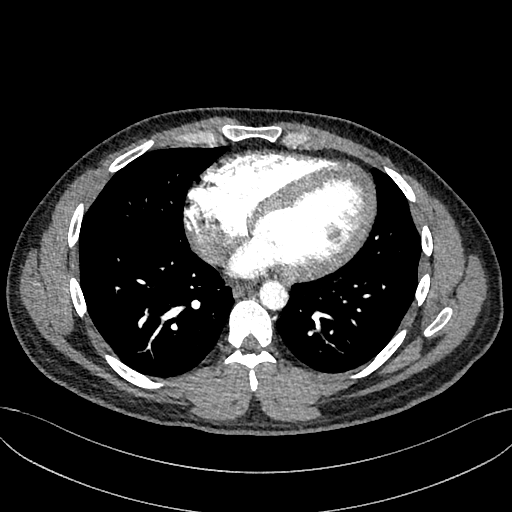
[im 195/439  lung]
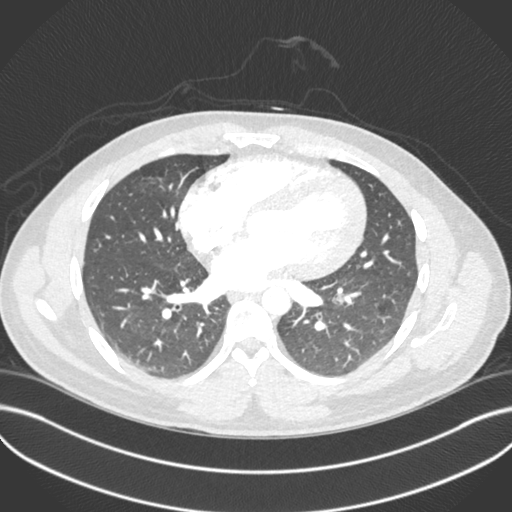
[im 244/439  soft-tissue]
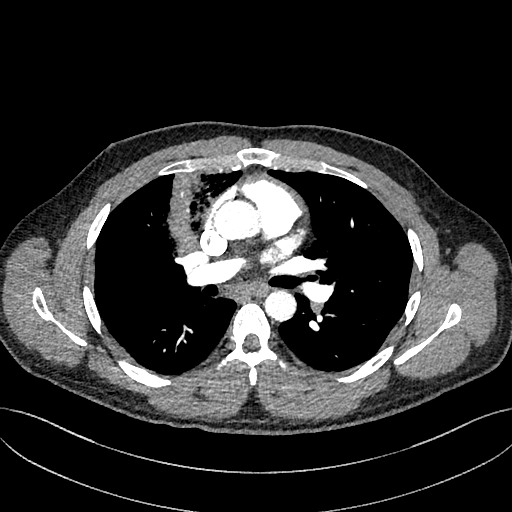
[im 268/439  lung]
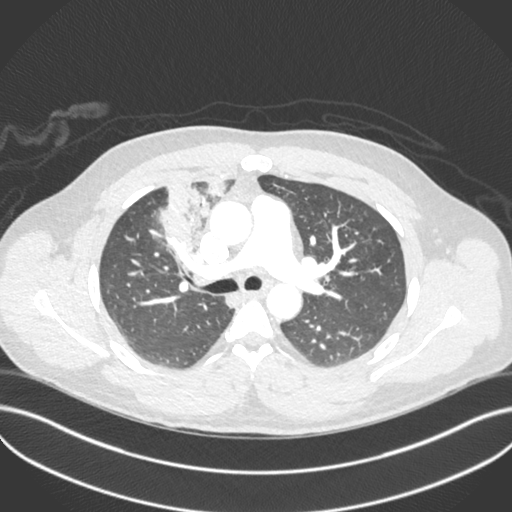
[im 293/439  soft-tissue]
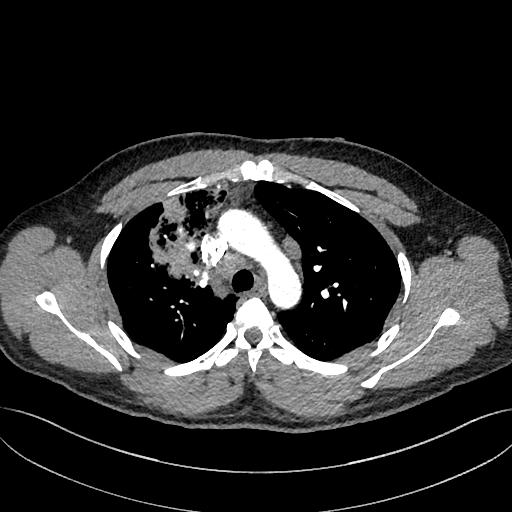
[im 317/439  lung]
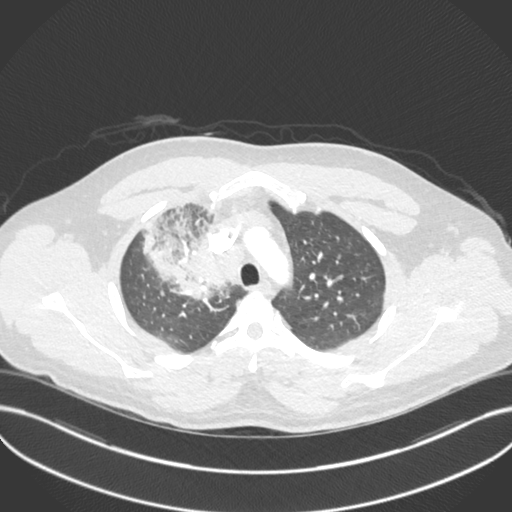
[im 341/439  soft-tissue]
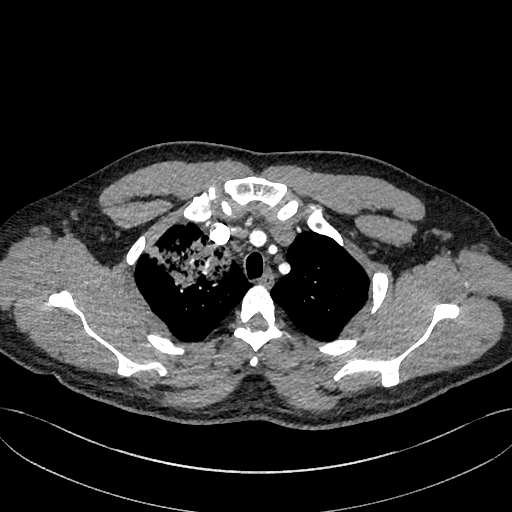
[im 390/439  lung]
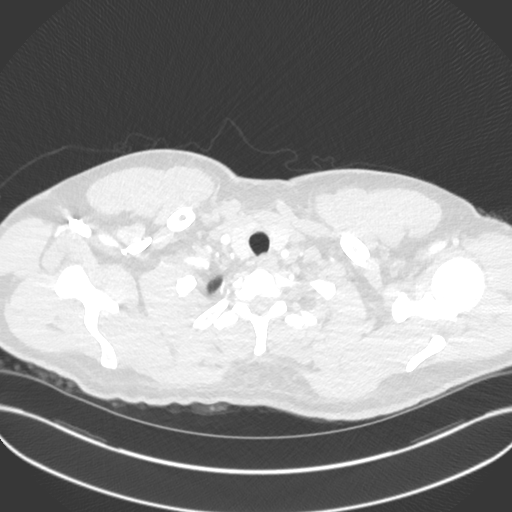
[im 414/439  soft-tissue]
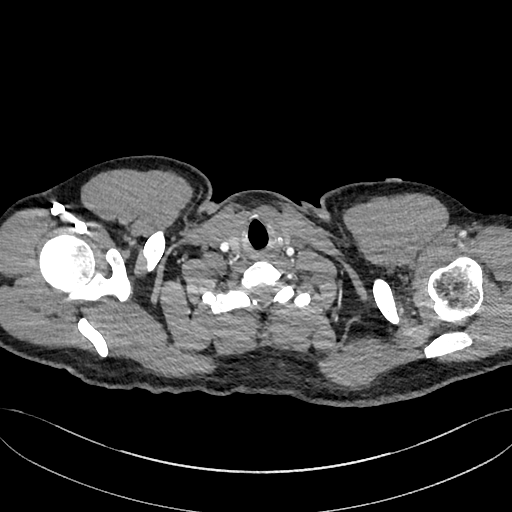

[Series 8: cor · coronal · 0.64mm/px · 3 of 142 slices shown]
[im 36/142  soft-tissue]
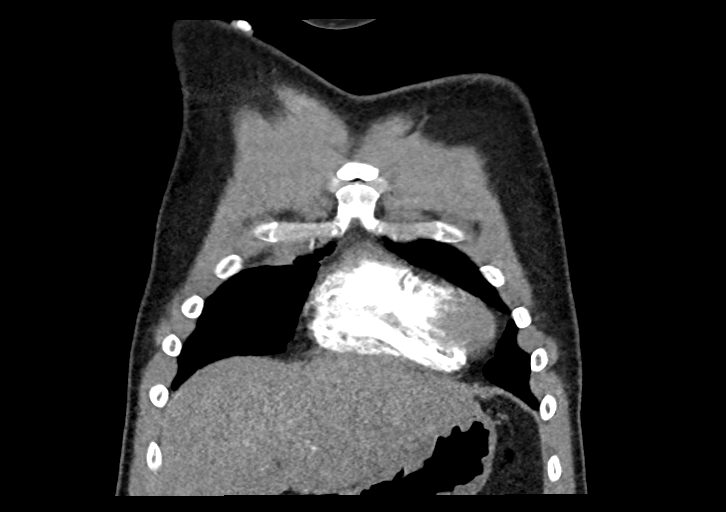
[im 71/142  soft-tissue]
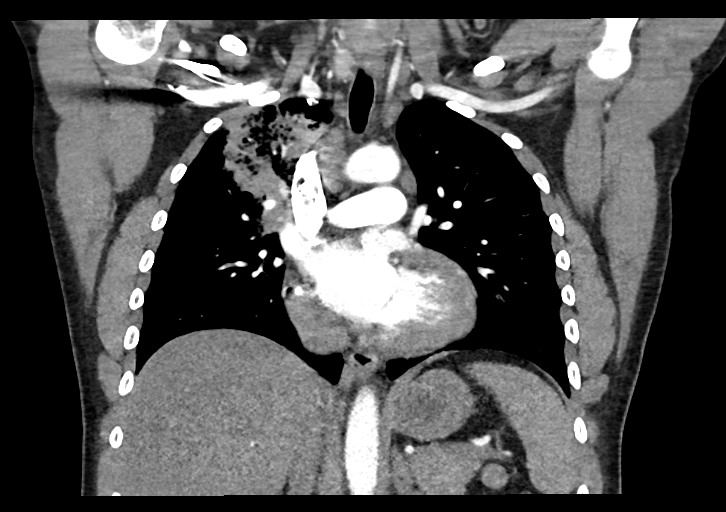
[im 106/142  soft-tissue]
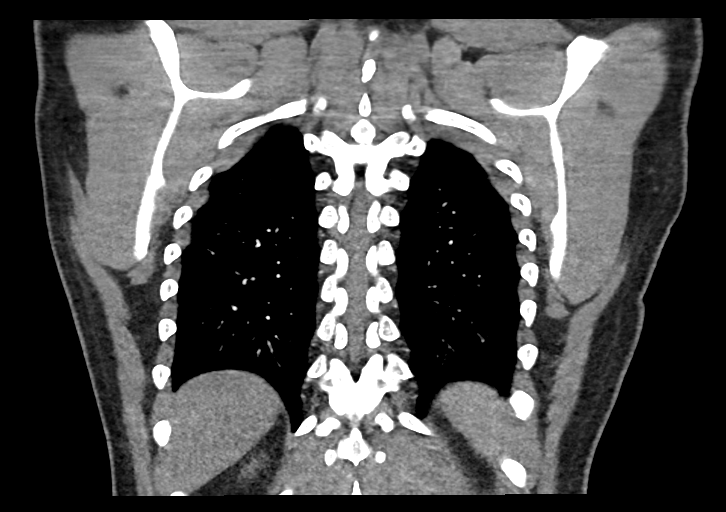

[17 of 46 positions shown; findings below may reference images not displayed]

FINDINGS: Cardiovascular: There is no cardiomegaly or pericardial effusion.
The thoracic aorta is unremarkable. No pulmonary artery embolus
identified. Apparent faint focus of filling defect in the
subsegmental left upper lobe pulmonary artery branch (139/7) is
artifactual.

Mediastinum/Nodes: There is right hilar and mediastinal adenopathy.
Right paratracheal lymph node measures 16 mm in short axis. The
esophagus is grossly unremarkable. No mediastinal fluid collection.

Lungs/Pleura: There is an area of consolidation involving the
anterior segment of the right upper lobe most consistent with
pneumonia. Clinical correlation and follow-up to resolution
recommended to exclude an underlying mass. There is a 1.3 x 1.2 cm
left lobe nodule with irregular, somewhat spiculated margins. There
is no pleural effusion pneumothorax. The central airways are patent.

Upper Abdomen: Fatty liver.

Musculoskeletal: No chest wall abnormality. No acute or significant
osseous findings.

Review of the MIP images confirms the above findings.
IMPRESSION: 1. No CT evidence of pulmonary artery embolus.
2. Right upper lobe consolidation most consistent with pneumonia.
Clinical correlation and follow-up to resolution recommended to
exclude an underlying mass.
3. Right hilar and mediastinal adenopathy, likely reactive.
4. A 1.3 x 1.2 cm left upper lobe nodule with irregular, somewhat
spiculated margins. Multidisciplinary consult and further evaluation
with PET-CT, if clinically indicated, recommended.
5. Fatty liver.

## 2023-08-04 ENCOUNTER — Emergency Department (HOSPITAL_COMMUNITY)
Admission: EM | Admit: 2023-08-04 | Discharge: 2023-08-05 | Disposition: A | Discharge status: Home / Self Care | Attending: Emergency Medicine | Admitting: Emergency Medicine

## 2023-08-04 ENCOUNTER — Emergency Department (HOSPITAL_COMMUNITY)

## 2023-08-04 ENCOUNTER — Other Ambulatory Visit: Payer: Self-pay

## 2023-08-04 ENCOUNTER — Encounter (HOSPITAL_COMMUNITY): Payer: Self-pay | Admitting: *Deleted

## 2023-08-04 DIAGNOSIS — E119 Type 2 diabetes mellitus without complications: Secondary | ICD-10-CM | POA: Insufficient documentation

## 2023-08-04 DIAGNOSIS — Z794 Long term (current) use of insulin: Secondary | ICD-10-CM | POA: Insufficient documentation

## 2023-08-04 DIAGNOSIS — R079 Chest pain, unspecified: Secondary | ICD-10-CM | POA: Diagnosis present

## 2023-08-04 DIAGNOSIS — R0789 Other chest pain: Secondary | ICD-10-CM | POA: Diagnosis not present

## 2023-08-04 DIAGNOSIS — Z7984 Long term (current) use of oral hypoglycemic drugs: Secondary | ICD-10-CM | POA: Insufficient documentation

## 2023-08-04 DIAGNOSIS — F17200 Nicotine dependence, unspecified, uncomplicated: Secondary | ICD-10-CM | POA: Diagnosis not present

## 2023-08-04 LAB — CBC
HCT: 49.1 % (ref 39.0–52.0)
Hemoglobin: 16.4 g/dL (ref 13.0–17.0)
MCH: 31.3 pg (ref 26.0–34.0)
MCHC: 33.4 g/dL (ref 30.0–36.0)
MCV: 93.7 fL (ref 80.0–100.0)
Platelets: 190 10*3/uL (ref 150–400)
RBC: 5.24 MIL/uL (ref 4.22–5.81)
RDW: 12.3 % (ref 11.5–15.5)
WBC: 6.6 10*3/uL (ref 4.0–10.5)
nRBC: 0 % (ref 0.0–0.2)

## 2023-08-04 LAB — BASIC METABOLIC PANEL WITH GFR
Anion gap: 12 (ref 5–15)
BUN: 11 mg/dL (ref 6–20)
CO2: 24 mmol/L (ref 22–32)
Calcium: 9.2 mg/dL (ref 8.9–10.3)
Chloride: 105 mmol/L (ref 98–111)
Creatinine, Ser: 0.95 mg/dL (ref 0.61–1.24)
GFR, Estimated: >60 mL/min (ref >60)
Glucose, Bld: 212 mg/dL — ABNORMAL HIGH (ref 70–99)
Potassium: 4.2 mmol/L (ref 3.5–5.1)
Sodium: 141 mmol/L (ref 135–145)

## 2023-08-04 LAB — TROPONIN I (HIGH SENSITIVITY): Troponin I (High Sensitivity): 14 ng/L (ref <18)

## 2023-08-04 NOTE — ED Triage Notes (Signed)
 The pt is c/o rt sided mid chest pain  just around 2130 no injury  no sob  nausea or dizziness

## 2023-08-05 LAB — TROPONIN I (HIGH SENSITIVITY): Troponin I (High Sensitivity): 12 ng/L (ref <18)

## 2023-08-05 MED ORDER — KETOROLAC TROMETHAMINE 15 MG/ML IJ SOLN
15.0000 mg | Freq: Once | INTRAMUSCULAR | Status: AC
  Administered 2023-08-05: 15 mg via INTRAMUSCULAR
  Filled 2023-08-05: qty 1

## 2023-08-05 NOTE — ED Provider Notes (Signed)
  EMERGENCY DEPARTMENT AT [facility] Provider Note   CSN: [id number] Arrival date & time: 08/04/23  2216     History  Chief Complaint  Patient presents with   Chest Pain    Ricardo Boyer is a 33 y.o. male.  Patient with past medical history significant for atrial fibrillation, type II DM presents to the emergency department complaining of right sided chest pain.  He states it began suddenly at 930 this evening.  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  He denies shortness of breath, abdominal pain, nausea.   Chest Pain      Home Medications Prior to Admission medications   Medication Sig Start Date End Date Taking? Authorizing Provider  blood glucose meter kit and supplies KIT Dispense based on patient and insurance preference. Use up to four times daily as directed. 05/04/22   Mesner, Reymundo Caulk, MD  Insulin  Glargine (BASAGLAR  KWIKPEN) 100 UNIT/ML Inject 15 Units into the skin at bedtime for 7 days, THEN 18 Units at bedtime. 06/19/22 09/12/22  Senaida Dama, NP  Insulin  Pen Needle (B-D ULTRAFINE III SHORT PEN) 31G X 8 MM MISC UAD 05/19/22   Senaida Dama, NP  metFORMIN  (GLUCOPHAGE -XR) 500 MG 24 hr tablet Take 2 tablets (1,000 mg total) by mouth 2 (two) times daily with a meal. 06/19/22   Senaida Dama, NP  diltiazem  (CARDIZEM ) 30 MG tablet Take 1 tablet (30 mg total) by mouth as needed (for palpitations/ fast heart rates). 06/24/13 12/08/19  Horace Lye, PA-C      Allergies    Patient has no known allergies.    Review of Systems   Review of Systems  Cardiovascular:  Positive for chest pain.    Physical Exam Updated Vital Signs BP (!) 153/103 (BP Location: Right Arm)   Pulse 72   Temp 98.3 F (36.8 C) (Oral)   Resp 20   Ht 5\' 8"  (1.727 m)   Wt 86.2 kg   SpO2 100%   BMI 28.89 kg/m  Physical Exam Vitals and nursing note reviewed.  Constitutional:      General: He is not in acute  distress.    Appearance: He is well-developed.  HENT:     Head: Normocephalic and atraumatic.  Eyes:     Conjunctiva/sclera: Conjunctivae normal.  Cardiovascular:     Rate and Rhythm: Normal rate and regular rhythm.  Pulmonary:     Effort: Pulmonary effort is normal. No respiratory distress.     Breath sounds: Normal breath sounds.  Chest:     Chest wall: No tenderness.     Comments: No significant tenderness to palpation of the chest Abdominal:     Palpations: Abdomen is soft.     Tenderness: There is no abdominal tenderness.  Musculoskeletal:        General: No swelling.     Cervical back: Neck supple.  Skin:    General: Skin is warm and dry.     Capillary Refill: Capillary refill takes less than 2 seconds.  Neurological:     Mental Status: He is alert.  Psychiatric:        Mood and Affect: Mood normal.     ED Results / Procedures / Treatments   Labs (all labs ordered are listed, but only abnormal results are displayed) Labs Reviewed  BASIC METABOLIC PANEL WITH GFR - Abnormal; Notable for the following components:  Result Value   Glucose, Bld 212 (*)    All other components within normal limits  CBC  TROPONIN I (HIGH SENSITIVITY)  TROPONIN I (HIGH SENSITIVITY)    EKG EKG Interpretation Date/Time:  Thursday Aug 05 2023 04:07:42 EDT Ventricular Rate:  74 PR Interval:  129 QRS Duration:  92 QT Interval:  383 QTC Calculation: 425 R Axis:   59  Text Interpretation: Sinus rhythm Abnormal T, consider ischemia, diffuse leads Minimal ST elevation, anterior leads Seen on prior tracings Confirmed by Townsend Freud [phone]) on 08/05/2023 4:56:24 AM  Radiology DG Chest 2 View Result Date: 08/04/2023 CLINICAL DATA:  Right-sided chest pain EXAM: CHEST - 2 VIEW COMPARISON:  01/10/2021 FINDINGS: Mild bronchitic changes. No focal opacity, pleural effusion or pneumothorax. Normal cardiomediastinal silhouette. Cyst IMPRESSION: No active cardiopulmonary disease. Mild  bronchitic changes. Electronically Signed   By: Esmeralda Hedge M.D.   On: 08/04/2023 22:53    Procedures Procedures    Medications Ordered in ED Medications  ketorolac  (TORADOL ) 15 MG/ML injection 15 mg (15 mg Intramuscular Given 08/05/23 0407)    ED Course/ Medical Decision Making/ A&P                                 Medical Decision Making Risk Prescription drug management.   This patient presents to the ED for concern of chest pain, this involves an extensive number of treatment options, and is a complaint that carries with it a high risk of complications and morbidity.  The differential diagnosis includes musculoskeletal pain, ACS, pneumonia, PE, others   Co morbidities / Chronic conditions that complicate the patient evaluation  History of atrial fibrillation, type II DM   Additional history obtained:  Additional history obtained from EMR   Lab Tests:  I Ordered, and personally interpreted labs.  The pertinent results include: Grossly unremarkable CBC, BMP, initial troponin 14, repeat 12   Imaging Studies ordered:  I ordered imaging studies including chest x-ray I independently visualized and interpreted imaging which showed no acute findings I agree with the radiologist interpretation   Cardiac Monitoring: / EKG:  The patient was maintained on a cardiac monitor.  I personally viewed and interpreted the cardiac monitored which showed an underlying rhythm of: sinus rhythm   Problem List / ED Course / Critical interventions / Medication management   I ordered medication including toradol    Reevaluation of the patient after these medicines showed that the patient improved I have reviewed the patients home medicines and have made adjustments as needed   Social Determinants of Health:  Patient is a heavy smoker   Test / Admission - Considered:  Patient with negative troponins x 2, grossly nonischemic EKG.  No sign of ACS.  Chest x-ray was unremarkable.   Pain is reproducible with movement.  Feel this is musculoskeletal in nature.  Plan to recommend anti-inflammatory medication and Tylenol  for pain relief at home.  Patient stable for discharge at this time.  Return precautions provided.         Final Clinical Impression(s) / ED Diagnoses Final diagnoses:  Chest wall pain    Rx / DC Orders ED Discharge Orders     None         Delories Fetter 08/05/23 0458    Lindle Rhea, MD 08/06/23 [phone]

## 2023-08-05 NOTE — Discharge Instructions (Signed)
 Your workup this morning was reassuring.  Please follow-up with your primary care provider for further evaluation as needed.  If you develop any life-threatening symptoms please return to the emergency department.

## 2023-10-25 ENCOUNTER — Encounter (HOSPITAL_COMMUNITY): Payer: Self-pay

## 2023-10-25 ENCOUNTER — Emergency Department (HOSPITAL_COMMUNITY)
Admission: EM | Admit: 2023-10-25 | Discharge: 2023-10-25 | Disposition: A | Discharge status: Home / Self Care | Attending: Emergency Medicine | Admitting: Emergency Medicine

## 2023-10-25 ENCOUNTER — Other Ambulatory Visit: Payer: Self-pay

## 2023-10-25 DIAGNOSIS — R739 Hyperglycemia, unspecified: Secondary | ICD-10-CM | POA: Diagnosis present

## 2023-10-25 DIAGNOSIS — Z7984 Long term (current) use of oral hypoglycemic drugs: Secondary | ICD-10-CM | POA: Diagnosis not present

## 2023-10-25 DIAGNOSIS — Z794 Long term (current) use of insulin: Secondary | ICD-10-CM | POA: Insufficient documentation

## 2023-10-25 DIAGNOSIS — E1165 Type 2 diabetes mellitus with hyperglycemia: Secondary | ICD-10-CM | POA: Insufficient documentation

## 2023-10-25 LAB — CBC
HCT: 51.5 % (ref 39.0–52.0)
Hemoglobin: 17.6 g/dL — ABNORMAL HIGH (ref 13.0–17.0)
MCH: 30.9 pg (ref 26.0–34.0)
MCHC: 34.2 g/dL (ref 30.0–36.0)
MCV: 90.5 fL (ref 80.0–100.0)
Platelets: 208 K/uL (ref 150–400)
RBC: 5.69 MIL/uL (ref 4.22–5.81)
RDW: 11.9 % (ref 11.5–15.5)
WBC: 8.3 K/uL (ref 4.0–10.5)
nRBC: 0 % (ref 0.0–0.2)

## 2023-10-25 LAB — COMPREHENSIVE METABOLIC PANEL WITH GFR
ALT: 20 U/L (ref 0–44)
AST: 15 U/L (ref 15–41)
Albumin: 4.7 g/dL (ref 3.5–5.0)
Alkaline Phosphatase: 82 U/L (ref 38–126)
Anion gap: 10 (ref 5–15)
BUN: 13 mg/dL (ref 6–20)
CO2: 25 mmol/L (ref 22–32)
Calcium: 10.3 mg/dL (ref 8.9–10.3)
Chloride: 103 mmol/L (ref 98–111)
Creatinine, Ser: 0.96 mg/dL (ref 0.61–1.24)
GFR, Estimated: >60 mL/min (ref >60)
Glucose, Bld: 129 mg/dL — ABNORMAL HIGH (ref 70–99)
Potassium: 4.5 mmol/L (ref 3.5–5.1)
Sodium: 138 mmol/L (ref 135–145)
Total Bilirubin: 0.6 mg/dL (ref 0.0–1.2)
Total Protein: 7.6 g/dL (ref 6.5–8.1)

## 2023-10-25 LAB — CBG MONITORING, ED: Glucose-Capillary: 154 mg/dL — ABNORMAL HIGH (ref 70–99)

## 2023-10-25 MED ORDER — SODIUM CHLORIDE 0.9 % IV BOLUS
1000.0000 mL | Freq: Once | INTRAVENOUS | Status: AC
  Administered 2023-10-25: 1000 mL via INTRAVENOUS

## 2023-10-25 NOTE — ED Provider Notes (Signed)
 [place] EMERGENCY DEPARTMENT AT [facility] Provider Note   CSN: [id number] Arrival date & time: 10/25/23  1224     Patient presents with: Cramps and Hyperglycemia   Ricardo Boyer is a 33 y.o. male.   33 yo M with a chief complaint of hyperglycemia.  Off and on for the past few days.  Patient has a history of diabetes and is on metformin  as well as insulin .  He tells me that he does not take the insulin  usually and only takes the metformin  every so often.  Tells me that it makes his stomach hurt.  He had it in the 500s a couple days ago and had significant cramping to his hands and feet.  Seem to have gotten better over the weekend but had another event today after work and so came in for evaluation.  Denies chest pain denies fevers denies cough or congestion.   Hyperglycemia      Prior to Admission medications   Medication Sig Start Date End Date Taking? Authorizing Provider  blood glucose meter kit and supplies KIT Dispense based on patient and insurance preference. Use up to four times daily as directed. 05/04/22   Mesner, Selinda, MD  Insulin  Glargine (BASAGLAR  KWIKPEN) 100 UNIT/ML Inject 15 Units into the skin at bedtime for 7 days, THEN 18 Units at bedtime. 06/19/22 09/12/22  Lorren Greig PARAS, NP  Insulin  Pen Needle (B-D ULTRAFINE III SHORT PEN) 31G X 8 MM MISC UAD 05/19/22   Lorren Greig PARAS, NP  metFORMIN  (GLUCOPHAGE -XR) 500 MG 24 hr tablet Take 2 tablets (1,000 mg total) by mouth 2 (two) times daily with a meal. 06/19/22   Lorren Greig PARAS, NP  diltiazem  (CARDIZEM ) 30 MG tablet Take 1 tablet (30 mg total) by mouth as needed (for palpitations/ fast heart rates). 06/24/13 12/08/19  Marcine Caffie HERO, PA-C    Allergies: Patient has no known allergies.    Review of Systems  Updated Vital Signs BP (!) 137/98 (BP Location: Right Arm)   Pulse 69   Temp 98 F (36.7 C) (Oral)   Resp 15   Ht 5' 10 (1.778 m)   Wt 91.6 kg   SpO2 100%   BMI 28.98 kg/m   Physical  Exam Vitals and nursing note reviewed.  Constitutional:      Appearance: He is well-developed.  HENT:     Head: Normocephalic and atraumatic.  Eyes:     Pupils: Pupils are equal, round, and reactive to light.  Neck:     Vascular: No JVD.  Cardiovascular:     Rate and Rhythm: Normal rate and regular rhythm.     Heart sounds: No murmur heard.    No friction rub. No gallop.  Pulmonary:     Effort: No respiratory distress.     Breath sounds: No wheezing.  Abdominal:     General: There is no distension.     Tenderness: There is no abdominal tenderness. There is no guarding or rebound.  Musculoskeletal:        General: Normal range of motion.     Cervical back: Normal range of motion and neck supple.  Skin:    Coloration: Skin is not pale.     Findings: No rash.  Neurological:     Mental Status: He is alert and oriented to person, place, and time.  Psychiatric:        Behavior: Behavior normal.     (all labs ordered are listed, but only abnormal results are  displayed) Labs Reviewed  CBC - Abnormal; Notable for the following components:      Result Value   Hemoglobin 17.6 (*)    All other components within normal limits  COMPREHENSIVE METABOLIC PANEL WITH GFR - Abnormal; Notable for the following components:   Glucose, Bld 129 (*)    All other components within normal limits  CBG MONITORING, ED - Abnormal; Notable for the following components:   Glucose-Capillary 154 (*)    All other components within normal limits    EKG: None  Radiology: No results found.   Procedures   Medications Ordered in the ED  sodium chloride  0.9 % bolus 1,000 mL (0 mLs Intravenous Stopped 10/25/23 1401)                                    Medical Decision Making Amount and/or Complexity of Data Reviewed Labs: ordered.   33 yo M with a chief complaint of hyperglycemia.  By history this is most likely due to noncompliance.  His blood sugar was over 500 earlier today but he took his  insulin  which she does not normally take and got it a bit lower.  No signs of diabetic ketoacidosis on blood work.  Cramping is likely due to dehydration.  Given a bolus of IV fluids with some improvement here.  Will discharge home.  PCP follow-up.  4:00 PM:  I have discussed the diagnosis/risks/treatment options with the patient.  Evaluation and diagnostic testing in the emergency department does not suggest an emergent condition requiring admission or immediate intervention beyond what has been performed at this time.  They will follow up with PCP. We also discussed returning to the ED immediately if new or worsening sx occur. We discussed the sx which are most concerning (e.g., sudden worsening pain, fever, inability to tolerate by mouth) that necessitate immediate return. Medications administered to the patient during their visit and any new prescriptions provided to the patient are listed below.  Medications given during this visit Medications  sodium chloride  0.9 % bolus 1,000 mL (0 mLs Intravenous Stopped 10/25/23 1401)     The patient appears reasonably screen and/or stabilized for discharge and I doubt any other medical condition or other [facility] requiring further screening, evaluation, or treatment in the ED at this time prior to discharge.       Final diagnoses:  Hyperglycemia    ED Discharge Orders     None          Emil Share, DO 10/25/23 1600

## 2023-10-25 NOTE — Discharge Instructions (Signed)
 Please call your family doc and let them know about your visit here today.  They may want to change your diabetes regimen for home.  Please let them know that even though you are prescribed multiple medications that you are not taking them.  See if they get you on a regiment that you are comfortable with.  Please return for persistent high blood sugars or if you develop abdominal pain confusion or inability to eat or drink.

## 2023-10-25 NOTE — ED Triage Notes (Signed)
 Pt c.o cramping in his legs/feet/hands since yesterday and blood sugars in the 200s. Denies n/v abd pain

## 2023-10-28 ENCOUNTER — Emergency Department (HOSPITAL_COMMUNITY)
Admission: EM | Admit: 2023-10-28 | Discharge: 2023-10-29 | Disposition: A | Discharge status: Home / Self Care | Attending: Emergency Medicine | Admitting: Emergency Medicine

## 2023-10-28 ENCOUNTER — Other Ambulatory Visit: Payer: Self-pay

## 2023-10-28 DIAGNOSIS — Z7984 Long term (current) use of oral hypoglycemic drugs: Secondary | ICD-10-CM | POA: Diagnosis not present

## 2023-10-28 DIAGNOSIS — E1165 Type 2 diabetes mellitus with hyperglycemia: Secondary | ICD-10-CM | POA: Insufficient documentation

## 2023-10-28 DIAGNOSIS — Z794 Long term (current) use of insulin: Secondary | ICD-10-CM | POA: Insufficient documentation

## 2023-10-28 DIAGNOSIS — E871 Hypo-osmolality and hyponatremia: Secondary | ICD-10-CM | POA: Diagnosis not present

## 2023-10-28 DIAGNOSIS — R739 Hyperglycemia, unspecified: Secondary | ICD-10-CM | POA: Diagnosis present

## 2023-10-28 LAB — CBG MONITORING, ED: Glucose-Capillary: >600 mg/dL (ref 70–99)

## 2023-10-28 NOTE — ED Triage Notes (Signed)
 Pt comes in stating that his sugar is low or hight. He was at work and started feeling dizzy. This afternoon cbg was 268 and he has not checked since then. Pt has increased urinary frequency

## 2023-10-29 ENCOUNTER — Encounter (HOSPITAL_COMMUNITY): Payer: Self-pay

## 2023-10-29 LAB — BASIC METABOLIC PANEL WITH GFR
Anion gap: 11 (ref 5–15)
BUN: 12 mg/dL (ref 6–20)
CO2: 25 mmol/L (ref 22–32)
Calcium: 9.3 mg/dL (ref 8.9–10.3)
Chloride: 94 mmol/L — ABNORMAL LOW (ref 98–111)
Creatinine, Ser: 1.3 mg/dL — ABNORMAL HIGH (ref 0.61–1.24)
GFR, Estimated: >60 mL/min (ref >60)
Glucose, Bld: 627 mg/dL (ref 70–99)
Potassium: 4.6 mmol/L (ref 3.5–5.1)
Sodium: 130 mmol/L — ABNORMAL LOW (ref 135–145)

## 2023-10-29 LAB — I-STAT CHEM 8, ED
BUN: 14 mg/dL (ref 6–20)
Calcium, Ion: 1.02 mmol/L — ABNORMAL LOW (ref 1.15–1.40)
Chloride: 94 mmol/L — ABNORMAL LOW (ref 98–111)
Creatinine, Ser: 1.3 mg/dL — ABNORMAL HIGH (ref 0.61–1.24)
Glucose, Bld: 597 mg/dL (ref 70–99)
HCT: 48 % (ref 39.0–52.0)
Hemoglobin: 16.3 g/dL (ref 13.0–17.0)
Potassium: 4.9 mmol/L (ref 3.5–5.1)
Sodium: 130 mmol/L — ABNORMAL LOW (ref 135–145)
TCO2: 27 mmol/L (ref 22–32)

## 2023-10-29 LAB — CBC
HCT: 46.3 % (ref 39.0–52.0)
Hemoglobin: 15.8 g/dL (ref 13.0–17.0)
MCH: 31 pg (ref 26.0–34.0)
MCHC: 34.1 g/dL (ref 30.0–36.0)
MCV: 90.8 fL (ref 80.0–100.0)
Platelets: 160 K/uL (ref 150–400)
RBC: 5.1 MIL/uL (ref 4.22–5.81)
RDW: 11.8 % (ref 11.5–15.5)
WBC: 4.8 K/uL (ref 4.0–10.5)
nRBC: 0 % (ref 0.0–0.2)

## 2023-10-29 LAB — URINALYSIS, ROUTINE W REFLEX MICROSCOPIC
Bacteria, UA: NONE SEEN
Bilirubin Urine: NEGATIVE
Glucose, UA: >=500 mg/dL — AB
Hgb urine dipstick: NEGATIVE
Ketones, ur: NEGATIVE mg/dL
Leukocytes,Ua: NEGATIVE
Nitrite: NEGATIVE
Protein, ur: NEGATIVE mg/dL
Specific Gravity, Urine: 1.026 (ref 1.005–1.030)
pH: 6 (ref 5.0–8.0)

## 2023-10-29 LAB — I-STAT VENOUS BLOOD GAS, ED
Acid-Base Excess: 3 mmol/L — ABNORMAL HIGH (ref 0.0–2.0)
Bicarbonate: 28.2 mmol/L — ABNORMAL HIGH (ref 20.0–28.0)
Calcium, Ion: 1.06 mmol/L — ABNORMAL LOW (ref 1.15–1.40)
HCT: 47 % (ref 39.0–52.0)
Hemoglobin: 16 g/dL (ref 13.0–17.0)
O2 Saturation: 100 %
Potassium: 4.9 mmol/L (ref 3.5–5.1)
Sodium: 129 mmol/L — ABNORMAL LOW (ref 135–145)
TCO2: 30 mmol/L (ref 22–32)
pCO2, Ven: 45.1 mmHg (ref 44–60)
pH, Ven: 7.404 (ref 7.25–7.43)
pO2, Ven: 171 mmHg — ABNORMAL HIGH (ref 32–45)

## 2023-10-29 LAB — CBG MONITORING, ED
Glucose-Capillary: 423 mg/dL — ABNORMAL HIGH (ref 70–99)
Glucose-Capillary: 360 mg/dL — ABNORMAL HIGH (ref 70–99)

## 2023-10-29 LAB — BETA-HYDROXYBUTYRIC ACID: Beta-Hydroxybutyric Acid: 0.13 mmol/L (ref 0.05–0.27)

## 2023-10-29 MED ORDER — BASAGLAR KWIKPEN 100 UNIT/ML ~~LOC~~ SOPN: PEN_INJECTOR | SUBCUTANEOUS | 0 refills | Status: DC

## 2023-10-29 MED ORDER — INSULIN ASPART 100 UNIT/ML IJ SOLN
8.0000 [IU] | Freq: Once | INTRAMUSCULAR | Status: AC
  Administered 2023-10-29: 8 [IU] via SUBCUTANEOUS

## 2023-10-29 MED ORDER — SODIUM CHLORIDE 0.9 % IV BOLUS
1000.0000 mL | Freq: Once | INTRAVENOUS | Status: AC
  Administered 2023-10-29: 1000 mL via INTRAVENOUS

## 2023-10-29 NOTE — ED Notes (Signed)
Pt ambulated to bathroom and back to room with steady gait.

## 2023-10-29 NOTE — Discharge Instructions (Addendum)
 Today you were seen for hyperglycemia.  Please pick up your medications and take as prescribed.  Please return to the ED if you have uncontrollable vomiting or worsening symptoms.  Thank you for letting us  treat you today. After reviewing your labs, I feel you are safe to go home. Please follow up with your PCP in the next several days and provide them with your records from this visit. Return to the Emergency Room if pain becomes severe or symptoms worsen.

## 2023-10-29 NOTE — ED Provider Notes (Signed)
 [place] EMERGENCY DEPARTMENT AT [facility] Provider Note   CSN: [id number] Arrival date & time: 10/28/23  2343     Patient presents with: Hyperglycemia   Ricardo Boyer Boyer is a 33 y.o. male past medical history significant for diabetes presents today for dizziness at work and feels that his blood sugars either high or low.  Patient checked his glucose this afternoon and it was 268 but has not checked it since then.  Patient endorses polyuria and polydipsia.  Patient denies nausea, vomiting, fever, chills, abdominal pain, chest pain, shortness of breath, any other complaints at this time.  Patient states that he had his insulin  not refrigerated for some time and suspects it is not working because of this.    Hyperglycemia Associated symptoms: polyuria        Prior to Admission medications   Medication Sig Start Date End Date Taking? Authorizing Provider  ibuprofen  (ADVIL ) 200 MG tablet Take 400 mg by mouth every 6 (six) hours as needed for headache.   Yes [provider]  Insulin  Glargine (BASAGLAR  KWIKPEN) 100 UNIT/ML Inject 15 Units into the skin at bedtime for 7 days, THEN 18 Units at bedtime. 10/29/23 01/22/24 Yes Lexia Vandevender N, PA-C  metFORMIN  (GLUCOPHAGE -XR) 500 MG 24 hr tablet Take 2 tablets (1,000 mg total) by mouth 2 (two) times daily with a meal. 06/19/22  Yes Lorren, Amy J, NP  diltiazem  (CARDIZEM ) 30 MG tablet Take 1 tablet (30 mg total) by mouth as needed (for palpitations/ fast heart rates). 06/24/13 12/08/19  Marcine Caffie HERO, PA-C    Allergies: Patient has no known allergies.    Review of Systems  Endocrine: Positive for polyuria.    Updated Vital Signs BP (!) 138/90 (BP Location: Right Arm)   Pulse 80   Temp 98 F (36.7 C) (Oral)   Resp 17   Ht 5' 9 (1.753 m)   Wt 93 kg   SpO2 97%   BMI 30.27 kg/m   Physical Exam Vitals and nursing note reviewed.  Constitutional:      General: He is not in acute distress.    Appearance:  Normal appearance. He is well-developed. He is not toxic-appearing.  HENT:     Head: Normocephalic and atraumatic.     Right Ear: External ear normal.     Left Ear: External ear normal.     Mouth/Throat:     Mouth: Mucous membranes are moist.     Pharynx: Oropharynx is clear.  Eyes:     Extraocular Movements: Extraocular movements intact.     Conjunctiva/sclera: Conjunctivae normal.  Cardiovascular:     Rate and Rhythm: Normal rate and regular rhythm.     Pulses: Normal pulses.     Heart sounds: Normal heart sounds. No murmur heard. Pulmonary:     Effort: Pulmonary effort is normal. No respiratory distress.     Breath sounds: Normal breath sounds.  Abdominal:     Palpations: Abdomen is soft.     Tenderness: There is no abdominal tenderness.  Musculoskeletal:        General: No swelling.     Cervical back: Neck supple.  Skin:    General: Skin is warm and dry.     Capillary Refill: Capillary refill takes less than 2 seconds.  Neurological:     General: No focal deficit present.     Mental Status: He is alert and oriented to person, place, and time.  Psychiatric:        Mood and  Affect: Mood normal.     (all labs ordered are listed, but only abnormal results are displayed) Labs Reviewed  URINALYSIS, ROUTINE W REFLEX MICROSCOPIC - Abnormal; Notable for the following components:      Result Value   Color, Urine COLORLESS (*)    Glucose, UA >=500 (*)    All other components within normal limits  BASIC METABOLIC PANEL WITH GFR - Abnormal; Notable for the following components:   Sodium 130 (*)    Chloride 94 (*)    Glucose, Bld 627 (*)    Creatinine, Ser 1.30 (*)    All other components within normal limits  CBG MONITORING, ED - Abnormal; Notable for the following components:   Glucose-Capillary >600 (*)    All other components within normal limits  I-STAT CHEM 8, ED - Abnormal; Notable for the following components:   Sodium 130 (*)    Chloride 94 (*)    Creatinine, Ser  1.30 (*)    Glucose, Bld 597 (*)    Calcium , Ion 1.02 (*)    All other components within normal limits  I-STAT VENOUS BLOOD GAS, ED - Abnormal; Notable for the following components:   pO2, Ven 171 (*)    Bicarbonate 28.2 (*)    Acid-Base Excess 3.0 (*)    Sodium 129 (*)    Calcium , Ion 1.06 (*)    All other components within normal limits  CBG MONITORING, ED - Abnormal; Notable for the following components:   Glucose-Capillary 423 (*)    All other components within normal limits  CBG MONITORING, ED - Abnormal; Notable for the following components:   Glucose-Capillary 360 (*)    All other components within normal limits  CBC  BETA-HYDROXYBUTYRIC ACID    EKG: None  Radiology: No results found.   Procedures   Medications Ordered in the ED  sodium chloride  0.9 % bolus 1,000 mL (1,000 mLs Intravenous New Bag/Given 10/29/23 0222)  insulin  aspart (novoLOG ) injection 8 Units (8 Units Subcutaneous Given 10/29/23 0223)                                    Medical Decision Making Amount and/or Complexity of Data Reviewed Labs: ordered.  Risk Prescription drug management.   This patient presents to the ED for concern of blood sugar issue differential diagnosis includes hyperglycemia, hypoglycemia, DKA, HHS  Lab Tests:  I Ordered, and personally interpreted labs.  The pertinent results include: Hyponatremia at 130 corrected to 138 in the setting of hyperglycemia, decreased chloride at 94, elevated creatinine at 1.3, CBC WNL, CBG greater than 600, beta hydroxybutyric acid 0.13, UA with greater than 500 glucose   Medicines ordered and prescription drug management:  I ordered medication including subcu insulin  and IVF    I have reviewed the patients home medicines and have made adjustments as needed   Problem List / ED Course:  Repeat CBG after fluids and subcu insulin  360 Considered for admission or further workup however patient's vital signs, physical exam, and labs are  reassuring.  Patient's insulin  reordered.  Patient given return precautions.  I feel patient is safe for discharge at this time.       Final diagnoses:  Hyperglycemia    ED Discharge Orders          Ordered    Insulin  Glargine (BASAGLAR  KWIKPEN) 100 UNIT/ML  QHS        10/29/23 0239  Francis Ileana SAILOR, PA-C 10/29/23 0457    Trine Raynell Moder, MD 10/29/23 [phone]

## 2023-12-07 ENCOUNTER — Encounter: Payer: Self-pay | Admitting: Emergency Medicine

## 2023-12-07 ENCOUNTER — Ambulatory Visit
Admission: EM | Admit: 2023-12-07 | Discharge: 2023-12-07 | Disposition: A | Discharge status: Home / Self Care | Attending: Emergency Medicine | Admitting: Emergency Medicine

## 2023-12-07 DIAGNOSIS — R350 Frequency of micturition: Secondary | ICD-10-CM | POA: Diagnosis present

## 2023-12-07 DIAGNOSIS — Z113 Encounter for screening for infections with a predominantly sexual mode of transmission: Secondary | ICD-10-CM | POA: Insufficient documentation

## 2023-12-07 DIAGNOSIS — Z794 Long term (current) use of insulin: Secondary | ICD-10-CM | POA: Insufficient documentation

## 2023-12-07 DIAGNOSIS — E1165 Type 2 diabetes mellitus with hyperglycemia: Secondary | ICD-10-CM | POA: Insufficient documentation

## 2023-12-07 LAB — POCT URINE DIPSTICK
Bilirubin, UA: NEGATIVE
Blood, UA: NEGATIVE
Glucose, UA: >=1000 mg/dL — AB
Ketones, POC UA: NEGATIVE mg/dL
Leukocytes, UA: NEGATIVE
Nitrite, UA: NEGATIVE
Protein Ur, POC: NEGATIVE mg/dL
Spec Grav, UA: <=1.005 — AB (ref 1.010–1.025)
Urobilinogen, UA: 0.2 U/dL
pH, UA: 6 (ref 5.0–8.0)

## 2023-12-07 LAB — GLUCOSE, POCT (MANUAL RESULT ENTRY): POCT Glucose (KUC): 534 mg/dL — AB (ref 70–99)

## 2023-12-07 MED ORDER — METFORMIN HCL ER 500 MG PO TB24: 1000.0000 mg | ORAL_TABLET | Freq: Two times a day (BID) | ORAL | 0 refills | Status: DC

## 2023-12-07 NOTE — ED Provider Notes (Signed)
 [facility]    CSN: [id number] Arrival date & time: 12/07/23  1444      History   Chief Complaint Chief Complaint  Patient presents with   Urinary Frequency    HPI Christophr Calix is a 33 y.o. male.  Urinary frequency for 2-3 months Diabetes, not controlled.non compliant.  Was in the ED last month and glucose was near 700 Reports he uses insulin  only sporadically. Supposed to take 15 units daily.  Ran out of metformin  unsure when.   Last saw diabetes clinic in January   Past Medical History:  Diagnosis Date   Diabetes mellitus without complication (HCC)    Dysrhythmia    atrial fibrilation   Gonorrhea     Patient Active Problem List   Diagnosis Date Noted   Abnormal CXR 07/11/2013   Tobacco abuse 07/11/2013   Atrial fibrillation (HCC) 06/23/2013    History reviewed. No pertinent surgical history.     Home Medications    Prior to Admission medications   Medication Sig Start Date End Date Taking? Authorizing Provider  Insulin  Glargine (BASAGLAR  KWIKPEN) 100 UNIT/ML Inject 15 Units into the skin at bedtime for 7 days, THEN 18 Units at bedtime. 10/29/23 01/22/24 Yes Keith, Kayla N, PA-C  ibuprofen  (ADVIL ) 200 MG tablet Take 400 mg by mouth every 6 (six) hours as needed for headache.    [provider]  metFORMIN  (GLUCOPHAGE -XR) 500 MG 24 hr tablet Take 2 tablets (1,000 mg total) by mouth 2 (two) times daily with a meal. 12/07/23   Jekhi Bolin, Asberry, PA-C  diltiazem  (CARDIZEM ) 30 MG tablet Take 1 tablet (30 mg total) by mouth as needed (for palpitations/ fast heart rates). 06/24/13 12/08/19  Marcine Caffie HERO, PA-C    Family History Family History  Problem Relation Age of Onset   Diabetes Mother     Social History Social History   Tobacco Use   Smoking status: Heavy Smoker    Current packs/day: 0.50    Average packs/day: 0.5 packs/day for 2.0 years (1.0 ttl pk-yrs)    Types: Cigarettes    Passive exposure: Current   Smokeless  tobacco: Never  Vaping Use   Vaping status: Never Used  Substance Use Topics   Alcohol use: Never   Drug use: No     Allergies   Patient has no known allergies.   Review of Systems Review of Systems  Genitourinary:  Positive for frequency.   As per HPI  Physical Exam Triage Vital Signs ED Triage Vitals  Encounter Vitals Group     BP 12/07/23 1603 124/83     Girls Systolic BP Percentile --      Girls Diastolic BP Percentile --      Boys Systolic BP Percentile --      Boys Diastolic BP Percentile --      Pulse Rate 12/07/23 1603 83     Resp 12/07/23 1603 16     Temp 12/07/23 1603 97.9 F (36.6 C)     Temp Source 12/07/23 1603 Oral     SpO2 12/07/23 1603 97 %     Weight 12/07/23 1601 205 lb 0.4 oz (93 kg)     Height --      Head Circumference --      Peak Flow --      Pain Score 12/07/23 1601 0     Pain Loc --      Pain Education --      Exclude from Growth Chart --  No data found.  Updated Vital Signs BP 124/83 (BP Location: Left Arm)   Pulse 83   Temp 97.9 F (36.6 C) (Oral)   Resp 16   Wt 205 lb 0.4 oz (93 kg)   SpO2 97%   BMI 30.28 kg/m   Physical Exam Vitals and nursing note reviewed.  Constitutional:      Appearance: Normal appearance.  HENT:     Mouth/Throat:     Mouth: Mucous membranes are moist.     Pharynx: Oropharynx is clear.  Eyes:     Conjunctiva/sclera: Conjunctivae normal.  Cardiovascular:     Rate and Rhythm: Normal rate and regular rhythm.     Pulses: Normal pulses.     Heart sounds: Normal heart sounds.  Pulmonary:     Effort: Pulmonary effort is normal.     Breath sounds: Normal breath sounds.  Abdominal:     Palpations: Abdomen is soft.     Tenderness: There is no abdominal tenderness. There is no right CVA tenderness, left CVA tenderness, guarding or rebound.  Musculoskeletal:        General: Normal range of motion.  Skin:    General: Skin is warm and dry.  Neurological:     General: No focal deficit present.      Mental Status: He is alert and oriented to person, place, and time.     Cranial Nerves: No cranial nerve deficit.     Sensory: No sensory deficit.     Motor: No weakness.     Coordination: Coordination normal.     Gait: Gait normal.      UC Treatments / Results  Labs (all labs ordered are listed, but only abnormal results are displayed) Labs Reviewed  POCT URINE DIPSTICK - Abnormal; Notable for the following components:      Result Value   Glucose, UA >=1,000 (*)    Spec Grav, UA <=1.005 (*)    All other components within normal limits  GLUCOSE, POCT (MANUAL RESULT ENTRY) - Abnormal; Notable for the following components:   POCT Glucose ([facility]) 534 (*)    All other components within normal limits  CYTOLOGY, (ORAL, ANAL, URETHRAL) ANCILLARY ONLY    EKG   Radiology No results found.  Procedures Procedures (including critical care time)  Medications Ordered in UC Medications - No data to display  Initial Impression / Assessment and Plan / UC Course  I have reviewed the triage vital signs and the nursing notes.  Pertinent labs & imaging results that were available during my care of the patient were reviewed by me and considered in my medical decision making (see chart for details).  Stable vitals, well appearing.   UA with >1000 glucose CBG 534  Only symptom currently is urinary frequency, for months. Discussed this is caused by his uncontrolled diabetes. Needs to use insulin  as directed. I have refilled metformin . Recommend to contact diabetes clinic to make follow up appointment as soon as able. ED precautions are discussed.    At discharge he requests STD testing  Cytology swab pending   Final Clinical Impressions(s) / UC Diagnoses   Final diagnoses:  Urinary frequency  Type 2 diabetes mellitus with hyperglycemia, with long-term current use of insulin  (HCC)  Screen for STD (sexually transmitted disease)     Discharge Instructions      I have refilled  your metformin .  Please continue your insulin  Call the diabetes clinic to make a follow up visit. It's very important to manage your blood  sugars. The very high numbers are the cause of your urinary frequency. Once your diabetes is controlled, your symptoms should improve.     ED Prescriptions     Medication Sig Dispense Auth. Provider   metFORMIN  (GLUCOPHAGE -XR) 500 MG 24 hr tablet Take 2 tablets (1,000 mg total) by mouth 2 (two) times daily with a meal. 120 tablet Sekai Gitlin, Asberry, PA-C      PDMP not reviewed this encounter.   Jeryl Asberry, [place] 12/07/23 1742

## 2023-12-07 NOTE — Discharge Instructions (Signed)
 I have refilled your metformin .  Please continue your insulin  Call the diabetes clinic to make a follow up visit. It's very important to manage your blood sugars. The very high numbers are the cause of your urinary frequency. Once your diabetes is controlled, your symptoms should improve.

## 2023-12-07 NOTE — ED Triage Notes (Signed)
 Pt presents c/o frequent urination x  2 months. Pt reports He is prediabetic and uses insulin . Pt states, Usually the insulin  helps with frequent urination but not this time.  Pt denies any additional sxs.

## 2023-12-08 ENCOUNTER — Ambulatory Visit (HOSPITAL_COMMUNITY): Payer: Self-pay

## 2023-12-08 LAB — CYTOLOGY, (ORAL, ANAL, URETHRAL) ANCILLARY ONLY
Chlamydia: POSITIVE — AB
Comment: NEGATIVE
Comment: NEGATIVE
Comment: NORMAL
Neisseria Gonorrhea: NEGATIVE
Trichomonas: NEGATIVE

## 2023-12-08 MED ORDER — DOXYCYCLINE HYCLATE 100 MG PO TABS: 100.0000 mg | ORAL_TABLET | Freq: Two times a day (BID) | ORAL | 0 refills | Status: AC

## 2023-12-23 ENCOUNTER — Other Ambulatory Visit: Payer: Self-pay

## 2023-12-23 ENCOUNTER — Emergency Department (HOSPITAL_COMMUNITY)
Admission: EM | Admit: 2023-12-23 | Discharge: 2023-12-24 | Disposition: A | Discharge status: Home / Self Care | Attending: Emergency Medicine | Admitting: Emergency Medicine

## 2023-12-23 DIAGNOSIS — E871 Hypo-osmolality and hyponatremia: Secondary | ICD-10-CM | POA: Insufficient documentation

## 2023-12-23 DIAGNOSIS — R739 Hyperglycemia, unspecified: Secondary | ICD-10-CM

## 2023-12-23 DIAGNOSIS — R110 Nausea: Secondary | ICD-10-CM | POA: Diagnosis present

## 2023-12-23 DIAGNOSIS — E1165 Type 2 diabetes mellitus with hyperglycemia: Secondary | ICD-10-CM | POA: Diagnosis not present

## 2023-12-23 DIAGNOSIS — Z7984 Long term (current) use of oral hypoglycemic drugs: Secondary | ICD-10-CM | POA: Insufficient documentation

## 2023-12-23 DIAGNOSIS — Z794 Long term (current) use of insulin: Secondary | ICD-10-CM | POA: Insufficient documentation

## 2023-12-23 LAB — COMPREHENSIVE METABOLIC PANEL WITH GFR
ALT: 12 U/L (ref 0–44)
AST: 13 U/L — ABNORMAL LOW (ref 15–41)
Albumin: 4 g/dL (ref 3.5–5.0)
Alkaline Phosphatase: 124 U/L (ref 38–126)
Anion gap: 11 (ref 5–15)
BUN: 11 mg/dL (ref 6–20)
CO2: 28 mmol/L (ref 22–32)
Calcium: 9.6 mg/dL (ref 8.9–10.3)
Chloride: 93 mmol/L — ABNORMAL LOW (ref 98–111)
Creatinine, Ser: 1.12 mg/dL (ref 0.61–1.24)
GFR, Estimated: >60 mL/min (ref >60)
Glucose, Bld: 567 mg/dL (ref 70–99)
Potassium: 4.5 mmol/L (ref 3.5–5.1)
Sodium: 132 mmol/L — ABNORMAL LOW (ref 135–145)
Total Bilirubin: 0.7 mg/dL (ref 0.0–1.2)
Total Protein: 6.7 g/dL (ref 6.5–8.1)

## 2023-12-23 LAB — URINALYSIS, ROUTINE W REFLEX MICROSCOPIC
Bacteria, UA: NONE SEEN
Bilirubin Urine: NEGATIVE
Glucose, UA: >=500 mg/dL — AB
Hgb urine dipstick: NEGATIVE
Ketones, ur: 5 mg/dL — AB
Leukocytes,Ua: NEGATIVE
Nitrite: NEGATIVE
Protein, ur: NEGATIVE mg/dL
Specific Gravity, Urine: 1.03 (ref 1.005–1.030)
pH: 6 (ref 5.0–8.0)

## 2023-12-23 LAB — CBC WITH DIFFERENTIAL/PLATELET
Abs Immature Granulocytes: 0.02 K/uL (ref 0.00–0.07)
Basophils Absolute: 0.1 K/uL (ref 0.0–0.1)
Basophils Relative: 1 %
Eosinophils Absolute: 0.2 K/uL (ref 0.0–0.5)
Eosinophils Relative: 2 %
HCT: 49.1 % (ref 39.0–52.0)
Hemoglobin: 16.7 g/dL (ref 13.0–17.0)
Immature Granulocytes: 0 %
Lymphocytes Relative: 31 %
Lymphs Abs: 2.5 K/uL (ref 0.7–4.0)
MCH: 31.1 pg (ref 26.0–34.0)
MCHC: 34 g/dL (ref 30.0–36.0)
MCV: 91.4 fL (ref 80.0–100.0)
Monocytes Absolute: 0.8 K/uL (ref 0.1–1.0)
Monocytes Relative: 9 %
Neutro Abs: 4.7 K/uL (ref 1.7–7.7)
Neutrophils Relative %: 57 %
Platelets: 205 K/uL (ref 150–400)
RBC: 5.37 MIL/uL (ref 4.22–5.81)
RDW: 12 % (ref 11.5–15.5)
WBC: 8.2 K/uL (ref 4.0–10.5)
nRBC: 0 % (ref 0.0–0.2)

## 2023-12-23 LAB — BETA-HYDROXYBUTYRIC ACID: Beta-Hydroxybutyric Acid: 0.47 mmol/L — ABNORMAL HIGH (ref 0.05–0.27)

## 2023-12-23 LAB — CBG MONITORING, ED: Glucose-Capillary: 567 mg/dL (ref 70–99)

## 2023-12-23 MED ORDER — INSULIN REGULAR(HUMAN) IN NACL 100-0.9 UT/100ML-% IV SOLN: INTRAVENOUS | Status: DC

## 2023-12-23 MED ORDER — LACTATED RINGERS IV SOLN: INTRAVENOUS | Status: DC

## 2023-12-23 MED ORDER — DEXTROSE 50 % IV SOLN: 0.0000 mL | INTRAVENOUS | Status: DC | PRN

## 2023-12-23 MED ORDER — DEXTROSE IN LACTATED RINGERS 5 % IV SOLN: INTRAVENOUS | Status: DC

## 2023-12-23 NOTE — ED Triage Notes (Signed)
 Patient states that his blood sugar a hour ago was >400. He states he feels dehydrated, sluggish and nauseous. He also reports that he has been having frequent urination.

## 2023-12-23 NOTE — ED Triage Notes (Signed)
 Current blood sugar is 577

## 2023-12-23 NOTE — ED Provider Notes (Signed)
 [place] EMERGENCY DEPARTMENT AT [facility] Provider Note   CSN: [id number] Arrival date & time: 12/23/23  2145     Patient presents with: Hyperglycemia   Ricardo Boyer is a 33 y.o. male past medical history significant for diabetes presents today for blood sugar greater than 400.  Patient reports fatigue, nausea, polydipsia and polyuria.  Patient denies shortness of breath, chest pain, vomiting, diarrhea, abdominal pain, fever or any missed doses of his diabetes medication.    Hyperglycemia Associated symptoms: increased thirst, nausea and polyuria        Prior to Admission medications   Medication Sig Start Date End Date Taking? Authorizing Provider  ibuprofen  (ADVIL ) 200 MG tablet Take 400 mg by mouth every 6 (six) hours as needed for headache.    [provider]  Insulin  Glargine (BASAGLAR  KWIKPEN) 100 UNIT/ML Inject 15 Units into the skin at bedtime for 7 days, THEN 18 Units at bedtime. 10/29/23 01/22/24  Jeanpierre Thebeau N, PA-C  metFORMIN  (GLUCOPHAGE -XR) 500 MG 24 hr tablet Take 2 tablets (1,000 mg total) by mouth 2 (two) times daily with a meal. 12/07/23   Rising, Asberry, PA-C  diltiazem  (CARDIZEM ) 30 MG tablet Take 1 tablet (30 mg total) by mouth as needed (for palpitations/ fast heart rates). 06/24/13 12/08/19  Marcine Caffie HERO, PA-C    Allergies: Patient has no known allergies.    Review of Systems  Gastrointestinal:  Positive for nausea.  Endocrine: Positive for polydipsia and polyuria.    Updated Vital Signs BP 119/80   Pulse 76   Temp 98.6 F (37 C) (Oral)   Resp 16   SpO2 94%   Physical Exam Vitals and nursing note reviewed.  Constitutional:      General: He is not in acute distress.    Appearance: Normal appearance. He is well-developed. He is not toxic-appearing.  HENT:     Head: Normocephalic and atraumatic.     Right Ear: External ear normal.     Left Ear: External ear normal.     Nose: Nose normal.     Mouth/Throat:      Mouth: Mucous membranes are moist.     Pharynx: Oropharynx is clear.  Eyes:     Extraocular Movements: Extraocular movements intact.     Conjunctiva/sclera: Conjunctivae normal.  Cardiovascular:     Rate and Rhythm: Normal rate and regular rhythm.     Pulses: Normal pulses.     Heart sounds: Normal heart sounds. No murmur heard. Pulmonary:     Effort: Pulmonary effort is normal. No respiratory distress.     Breath sounds: Normal breath sounds.  Abdominal:     General: There is no distension.     Palpations: Abdomen is soft.     Tenderness: There is no abdominal tenderness.  Musculoskeletal:        General: No swelling.     Cervical back: Normal range of motion and neck supple.  Skin:    General: Skin is warm and dry.     Capillary Refill: Capillary refill takes less than 2 seconds.  Neurological:     General: No focal deficit present.     Mental Status: He is alert and oriented to person, place, and time.  Psychiatric:        Mood and Affect: Mood normal.     (all labs ordered are listed, but only abnormal results are displayed) Labs Reviewed  COMPREHENSIVE METABOLIC PANEL WITH GFR - Abnormal; Notable for the following components:  Result Value   Sodium 132 (*)    Chloride 93 (*)    Glucose, Bld 567 (*)    AST 13 (*)    All other components within normal limits  URINALYSIS, ROUTINE W REFLEX MICROSCOPIC - Abnormal; Notable for the following components:   Color, Urine STRAW (*)    Glucose, UA >=500 (*)    Ketones, ur 5 (*)    All other components within normal limits  BETA-HYDROXYBUTYRIC ACID - Abnormal; Notable for the following components:   Beta-Hydroxybutyric Acid 0.47 (*)    All other components within normal limits  CBG MONITORING, ED - Abnormal; Notable for the following components:   Glucose-Capillary 567 (*)    All other components within normal limits  CBG MONITORING, ED - Abnormal; Notable for the following components:   Glucose-Capillary 374 (*)     All other components within normal limits  CBG MONITORING, ED - Abnormal; Notable for the following components:   Glucose-Capillary 401 (*)    All other components within normal limits  CBG MONITORING, ED - Abnormal; Notable for the following components:   Glucose-Capillary 282 (*)    All other components within normal limits  CBC WITH DIFFERENTIAL/PLATELET  I-STAT VENOUS BLOOD GAS, ED    EKG: None  Radiology: No results found.   Procedures   Medications Ordered in the ED  insulin  aspart (novoLOG ) injection 5 Units (5 Units Subcutaneous Given 12/24/23 0044)  sodium chloride  0.9 % bolus 1,000 mL (1,000 mLs Intravenous New Bag/Given 12/24/23 0045)                                    Medical Decision Making Amount and/or Complexity of Data Reviewed Labs: ordered.  Risk Prescription drug management.   This patient presents to the ED for concern of hyperglycemia differential diagnosis includes DKA, HHS, hyperglycemia    Additional history obtained   Additional history obtained from Electronic Medical Record External records from outside source obtained and reviewed including Care Everywhere   Lab Tests:  I Ordered, and personally interpreted labs.  The pertinent results include: UA with greater than 500 glucose and 5 ketones, elevated beta hydroxy at 0.47, CBC unremarkable, mild hyponatremia 132 corrected to 139 in the setting of hyperglycemia, reduced chloride at 93, hyperglycemia 567, reduced AST at 13   Medicines ordered and prescription drug management:  I ordered medication including insulin  and IVF    I have reviewed the patients home medicines and have made adjustments as needed Repeat CBG 282   Problem List / ED Course:  Consider for admission or further workup however patient's vital signs, physical exam, and labs are reassuring.  Patient does not appear to be in DKA or HHS at this time.  Patient advised to take diabetes medication as prescribed and  follow-up with his primary care for further evaluation workup.  Patient given return precautions.  I feel patient safe for discharge at this time.       Final diagnoses:  Hyperglycemia    ED Discharge Orders     None          Francis Ileana SAILOR, PA-C 12/24/23 0325    Pamella Ozell LABOR, DO 12/31/23 1614

## 2023-12-23 NOTE — ED Provider Notes (Incomplete)
 [place] EMERGENCY DEPARTMENT AT [facility] Provider Note   CSN: [id number] Arrival date & time: 12/23/23  2145     Patient presents with: Hyperglycemia   Ricardo Boyer is a 33 y.o. male past medical history significant for diabetes presents today for blood sugar greater than 400.  Patient reports fatigue, nausea, polydipsia and polyuria.  Patient denies shortness of breath, chest pain, vomiting, diarrhea, abdominal pain, fever or any missed doses of his diabetes medication.  {Add pertinent medical, surgical, social history, OB history to HPI:32947}  Hyperglycemia Associated symptoms: increased thirst, nausea and polyuria        Prior to Admission medications   Medication Sig Start Date End Date Taking? Authorizing Provider  ibuprofen  (ADVIL ) 200 MG tablet Take 400 mg by mouth every 6 (six) hours as needed for headache.    [provider]  Insulin  Glargine (BASAGLAR  KWIKPEN) 100 UNIT/ML Inject 15 Units into the skin at bedtime for 7 days, THEN 18 Units at bedtime. 10/29/23 01/22/24  Pape Parson N, PA-C  metFORMIN  (GLUCOPHAGE -XR) 500 MG 24 hr tablet Take 2 tablets (1,000 mg total) by mouth 2 (two) times daily with a meal. 12/07/23   Rising, Asberry, PA-C  diltiazem  (CARDIZEM ) 30 MG tablet Take 1 tablet (30 mg total) by mouth as needed (for palpitations/ fast heart rates). 06/24/13 12/08/19  Marcine Caffie HERO, PA-C    Allergies: Patient has no known allergies.    Review of Systems  Gastrointestinal:  Positive for nausea.  Endocrine: Positive for polydipsia and polyuria.    Updated Vital Signs BP (!) 149/93 (BP Location: Right Arm)   Pulse 90   Temp 98.6 F (37 C) (Oral)   Resp 16   SpO2 97%   Physical Exam Vitals and nursing note reviewed.  Constitutional:      General: He is not in acute distress.    Appearance: Normal appearance. He is well-developed. He is not toxic-appearing.  HENT:     Head: Normocephalic and atraumatic.     Right Ear:  External ear normal.     Left Ear: External ear normal.     Nose: Nose normal.     Mouth/Throat:     Mouth: Mucous membranes are moist.     Pharynx: Oropharynx is clear.  Eyes:     Extraocular Movements: Extraocular movements intact.     Conjunctiva/sclera: Conjunctivae normal.  Cardiovascular:     Rate and Rhythm: Normal rate and regular rhythm.     Pulses: Normal pulses.     Heart sounds: Normal heart sounds. No murmur heard. Pulmonary:     Effort: Pulmonary effort is normal. No respiratory distress.     Breath sounds: Normal breath sounds.  Abdominal:     General: There is no distension.     Palpations: Abdomen is soft.     Tenderness: There is no abdominal tenderness.  Musculoskeletal:        General: No swelling.     Cervical back: Normal range of motion and neck supple.  Skin:    General: Skin is warm and dry.     Capillary Refill: Capillary refill takes less than 2 seconds.  Neurological:     General: No focal deficit present.     Mental Status: He is alert and oriented to person, place, and time.  Psychiatric:        Mood and Affect: Mood normal.     (all labs ordered are listed, but only abnormal results are displayed) Labs Reviewed  COMPREHENSIVE METABOLIC PANEL WITH GFR - Abnormal; Notable for the following components:      Result Value   Sodium 132 (*)    Chloride 93 (*)    Glucose, Bld 567 (*)    AST 13 (*)    All other components within normal limits  URINALYSIS, ROUTINE W REFLEX MICROSCOPIC - Abnormal; Notable for the following components:   Color, Urine STRAW (*)    Glucose, UA >=500 (*)    Ketones, ur 5 (*)    All other components within normal limits  BETA-HYDROXYBUTYRIC ACID - Abnormal; Notable for the following components:   Beta-Hydroxybutyric Acid 0.47 (*)    All other components within normal limits  CBG MONITORING, ED - Abnormal; Notable for the following components:   Glucose-Capillary 567 (*)    All other components within normal limits   CBC WITH DIFFERENTIAL/PLATELET  I-STAT VENOUS BLOOD GAS, ED    EKG: None  Radiology: No results found.  {Document cardiac monitor, telemetry assessment procedure when appropriate:32947} Procedures   Medications Ordered in the ED - No data to display    {Click here for ABCD2, HEART and other calculators REFRESH Note before signing:1}                              Medical Decision Making Amount and/or Complexity of Data Reviewed Labs: ordered.   This patient presents to the ED for concern of hyperglycemia differential diagnosis includes DKA, HHS, hyperglycemia    Additional history obtained   Additional history obtained from Electronic Medical Record External records from outside source obtained and reviewed including Care Everywhere   Lab Tests:  I Ordered, and personally interpreted labs.  The pertinent results include: UA with greater than 500 glucose and 5 ketones, elevated beta hydroxy at 0.47, CBC unremarkable, mild hyponatremia 132 corrected to 139 in the setting of hyperglycemia, reduced chloride at 93, hyperglycemia 567, reduced AST at 13   Medicines ordered and prescription drug management:  I ordered medication including insulin  and IVF    I have reviewed the patients home medicines and have made adjustments as needed   Problem List / ED Course:  ***    {Document critical care time when appropriate  Document review of labs and clinical decision tools ie [id number], etc  Document your independent review of radiology images and any outside records  Document your discussion with family members, caretakers and with consultants  Document social determinants of health affecting pt's care  Document your decision making why or why not admission, treatments were needed:32947:::1}   Final diagnoses:  None    ED Discharge Orders     None

## 2023-12-24 LAB — CBG MONITORING, ED
Glucose-Capillary: 282 mg/dL — ABNORMAL HIGH (ref 70–99)
Glucose-Capillary: 374 mg/dL — ABNORMAL HIGH (ref 70–99)
Glucose-Capillary: 401 mg/dL — ABNORMAL HIGH (ref 70–99)

## 2023-12-24 MED ORDER — INSULIN ASPART 100 UNIT/ML IJ SOLN
5.0000 [IU] | Freq: Once | INTRAMUSCULAR | Status: AC
  Administered 2023-12-24: 5 [IU] via SUBCUTANEOUS

## 2023-12-24 MED ORDER — SODIUM CHLORIDE 0.9 % IV BOLUS
1000.0000 mL | Freq: Once | INTRAVENOUS | Status: AC
  Administered 2023-12-24: 1000 mL via INTRAVENOUS

## 2023-12-24 NOTE — ED Notes (Addendum)
Patient verbalizes understanding of discharge instructions. Opportunity for questioning and answers were provided. Armband removed by staff, pt discharged from ED. Ambulated out to lobby  

## 2023-12-24 NOTE — Discharge Instructions (Signed)
 Today you were seen for hyperglycemia.  Please make sure you are taking your medication as prescribed.  Please follow-up with your primary care for further evaluation workup.  Thank you for letting us  treat you today. After reviewing your labs, I feel you are safe to go home. Please follow up with your PCP in the next several days and provide them with your records from this visit. Return to the Emergency Room if pain becomes severe or symptoms worsen.

## 2024-01-06 ENCOUNTER — Encounter: Payer: Self-pay | Admitting: Emergency Medicine

## 2024-01-06 ENCOUNTER — Ambulatory Visit: Admission: EM | Admit: 2024-01-06 | Discharge: 2024-01-06 | Disposition: A | Discharge status: Home / Self Care

## 2024-01-06 DIAGNOSIS — R350 Frequency of micturition: Secondary | ICD-10-CM | POA: Insufficient documentation

## 2024-01-06 DIAGNOSIS — Z202 Contact with and (suspected) exposure to infections with a predominantly sexual mode of transmission: Secondary | ICD-10-CM | POA: Insufficient documentation

## 2024-01-06 DIAGNOSIS — E1165 Type 2 diabetes mellitus with hyperglycemia: Secondary | ICD-10-CM | POA: Diagnosis present

## 2024-01-06 LAB — POCT URINE DIPSTICK
Bilirubin, UA: NEGATIVE
Blood, UA: NEGATIVE
Glucose, UA: 500 mg/dL — AB
Ketones, POC UA: NEGATIVE mg/dL
Leukocytes, UA: NEGATIVE
Nitrite, UA: NEGATIVE
POC PROTEIN,UA: NEGATIVE
Spec Grav, UA: 1.01 (ref 1.010–1.025)
Urobilinogen, UA: 1 U/dL
pH, UA: 7 (ref 5.0–8.0)

## 2024-01-06 LAB — GLUCOSE, POCT (MANUAL RESULT ENTRY): POCT Glucose (KUC): 491 mg/dL — AB (ref 70–99)

## 2024-01-06 NOTE — ED Provider Notes (Signed)
 [facility]  Note:  This document was prepared using Conservation officer, historic buildings and may include unintentional dictation errors.  MRN: [id number] DOB: [birth year]  Subjective:   Ricardo Boyer is a 33 y.o. male presenting for evaluation of increased urinary frequency, urinary urgency, hyperglycemia and recent exposure to chlamydia.  Patient reports that he has known diabetes type 2 has been taking insulin  and metformin  with minimal improvement to hyperglycemia.  Patient also reports he has had increased urinary frequency and urgency and has known exposure to girlfriend who tested positive for chlamydia yesterday.  Patient would like STD testing to see if he has chlamydia as well if urinary symptoms are due to high blood glucose or urinary infection.  Patient denies any dysuria, fever, penile discharge, abdominal pain, flank pain.  No current facility-administered medications for this encounter.  Current Outpatient Medications:    Insulin  Glargine (BASAGLAR  KWIKPEN) 100 UNIT/ML, Inject 15 Units into the skin at bedtime for 7 days, THEN 18 Units at bedtime., Disp: 15 mL, Rfl: 0   metFORMIN  (GLUCOPHAGE -XR) 500 MG 24 hr tablet, Take 2 tablets (1,000 mg total) by mouth 2 (two) times daily with a meal., Disp: 120 tablet, Rfl: 0   ibuprofen  (ADVIL ) 200 MG tablet, Take 400 mg by mouth every 6 (six) hours as needed for headache., Disp: , Rfl:    No Known Allergies  Past Medical History:  Diagnosis Date   Diabetes mellitus without complication (HCC)    Dysrhythmia    atrial fibrilation   Gonorrhea      History reviewed. No pertinent surgical history.  Family History  Problem Relation Age of Onset   Diabetes Mother     Social History   Tobacco Use   Smoking status: Heavy Smoker    Current packs/day: 0.50    Average packs/day: 0.5 packs/day for 2.0 years (1.0 ttl pk-yrs)    Types: Cigarettes    Passive exposure: Current   Smokeless tobacco: Never  Vaping Use   Vaping  status: Never Used  Substance Use Topics   Alcohol use: Never   Drug use: No    ROS Refer to HPI for ROS details.  Objective:   Vitals: BP 117/80 (BP Location: Left Arm)   Pulse 83   Temp 98 F (36.7 C) (Oral)   Resp 18   SpO2 96%   Physical Exam Vitals and nursing note reviewed.  Constitutional:      General: He is not in acute distress.    Appearance: Normal appearance. He is well-developed. He is not ill-appearing or toxic-appearing.  HENT:     Head: Normocephalic.  Cardiovascular:     Rate and Rhythm: Normal rate.  Pulmonary:     Effort: Pulmonary effort is normal. No respiratory distress.  Abdominal:     Palpations: Abdomen is soft.     Tenderness: There is no abdominal tenderness. There is no right CVA tenderness or left CVA tenderness.  Skin:    General: Skin is warm and dry.  Neurological:     General: No focal deficit present.     Mental Status: He is alert and oriented to person, place, and time.  Psychiatric:        Mood and Affect: Mood normal.        Behavior: Behavior normal.     Procedures  Results for orders placed or performed during the hospital encounter of 01/06/24 (from the past 24 hours)  POCT URINE DIPSTICK     Status: Abnormal   Collection  Time: 01/06/24 11:49 AM  Result Value Ref Range   Color, UA light yellow (A) yellow   Clarity, UA clear clear   Glucose, UA =500 (A) negative mg/dL   Bilirubin, UA negative negative   Ketones, POC UA negative negative mg/dL   Spec Grav, UA 8.989 8.989 - 1.025   Blood, UA negative negative   pH, UA 7.0 5.0 - 8.0   POC PROTEIN,UA negative negative, trace   Urobilinogen, UA 1.0 0.2 or 1.0 E.U./dL   Nitrite, UA Negative Negative   Leukocytes, UA Negative Negative  POCT CBG (manual entry)     Status: Abnormal   Collection Time: 01/06/24 12:05 PM  Result Value Ref Range   POCT Glucose ([facility]) 491 (A) 70 - 99 mg/dL    No results found.   Assessment and Plan :     Discharge Instructions        1. Urinary frequency (Primary) - POCT URINE DIPSTICK completed in UC shows no leukocytes, no nitrite, no blood, positive for 500+ glucose consistent with uncontrolled diabetes.  2. Exposure to chlamydia - Cytology swab -Urethral; GC / Chlamydia, Trichomonas collected in UC and sent to lab for further testing results should be available in 2 to 3 days.  3. Uncontrolled type 2 diabetes mellitus with hyperglycemia (HCC) - POCT CBG (manual entry) complain UC reveals blood glucose of 491 mg/dL - Ambulatory referral to Endocrinology for further evaluation of uncontrolled diabetes and persistent hyperglycemia - Follow-up with primary care/family medicine for ongoing management of diabetes as well as any other medical concerns. -Continue to monitor symptoms for any change in severity if there is any escalation of current symptoms or development of new symptoms follow-up in ER for further evaluation and management.       Jovante Hammitt B Anthonny Schiller   Elzada Pytel, [place] B, [place] 01/06/24 1241

## 2024-01-06 NOTE — Discharge Instructions (Addendum)
  1. Urinary frequency (Primary) - POCT URINE DIPSTICK completed in UC shows no leukocytes, no nitrite, no blood, positive for 500+ glucose consistent with uncontrolled diabetes.  2. Exposure to chlamydia - Cytology swab -Urethral; GC / Chlamydia, Trichomonas collected in UC and sent to lab for further testing results should be available in 2 to 3 days.  3. Uncontrolled type 2 diabetes mellitus with hyperglycemia (HCC) - POCT CBG (manual entry) complain UC reveals blood glucose of 491 mg/dL - Ambulatory referral to Endocrinology for further evaluation of uncontrolled diabetes and persistent hyperglycemia - Follow-up with primary care/family medicine for ongoing management of diabetes as well as any other medical concerns. -Continue to monitor symptoms for any change in severity if there is any escalation of current symptoms or development of new symptoms follow-up in ER for further evaluation and management.

## 2024-01-06 NOTE — ED Triage Notes (Signed)
 Pt reports known exposure to chlamydia.No current symptoms. Report he received treatment related to this on 9/30. His gf did not finish her treatment and she tested positive for chlamydia again yesterday.   Pt later reports he is still having continued urinary frequency and urgency. Denies dysuria. Is diabetic and reports taking his meds as prescribed. Reports his sugars at home have still been high despite medication.

## 2024-01-07 ENCOUNTER — Ambulatory Visit: Admitting: Internal Medicine

## 2024-01-07 LAB — CYTOLOGY, (ORAL, ANAL, URETHRAL) ANCILLARY ONLY
Chlamydia: POSITIVE — AB
Comment: NEGATIVE
Comment: NEGATIVE
Comment: NORMAL
Neisseria Gonorrhea: NEGATIVE
Trichomonas: NEGATIVE

## 2024-01-07 NOTE — Progress Notes (Deleted)
 Name: Vikas Wegmann  MRN/ DOB: [id number], March 04, 1991   Age/ Sex: 33 y.o., male    PCP: Lorren Greig PARAS, NP   Reason for Endocrinology Evaluation: Type 2 Diabetes Mellitus     Date of Initial Endocrinology Visit: 01/07/2024     PATIENT IDENTIFIER: Mr. Furqan Gosselin is a 33 y.o. male with a past medical history of DM, A-fib. The patient presented for initial endocrinology clinic visit on 01/07/2024 for consultative assistance with his diabetes management.    HPI: Mr. Haselton was    Diagnosed with DM *** Prior Medications tried/Intolerance: *** Currently checking blood sugars *** x / day,  before breakfast and ***.  Hypoglycemia episodes : ***               Symptoms: ***                 Frequency: ***/  Hemoglobin A1c has ranged from *** in ***, peaking at *** in ***. Patient required assistance for hypoglycemia:  Patient has required hospitalization within the last 1 year from hyper or hypoglycemia: DKA 12/2023  In terms of diet, the patient ***   The patient was following up with [facility] DIABETES REGIMEN: Metformin  500 mg XR twice daily Basaglar   Statin: No ACE-I/ARB: No Prior Diabetic Education: Yes   METER DOWNLOAD SUMMARY: Date range evaluated: *** Fingerstick Blood Glucose Tests = *** Average Number Tests/Day = *** Overall Mean FS Glucose = *** Standard Deviation = ***  BG Ranges: Low = *** High = ***   Hypoglycemic Events/30 Days: BG < 50 = *** Episodes of symptomatic severe hypoglycemia = ***   DIABETIC COMPLICATIONS: Microvascular complications:  *** Denies: *** Last eye exam: Completed   Macrovascular complications:  *** Denies: CAD, PVD, CVA   PAST HISTORY: Past Medical History:  Past Medical History:  Diagnosis Date   Diabetes mellitus without complication (HCC)    Dysrhythmia    atrial fibrilation   Gonorrhea    Past Surgical History: No past surgical history on file.  Social History:  reports  that he has been smoking cigarettes. He has a 1 pack-year smoking history. He has been exposed to tobacco smoke. He has never used smokeless tobacco. He reports that he does not drink alcohol and does not use drugs. Family History:  Family History  Problem Relation Age of Onset   Diabetes Mother      HOME MEDICATIONS: Allergies as of 01/07/2024   No Known Allergies      Medication List        Accurate as of January 07, 2024 12:30 PM. If you have any questions, ask your nurse or doctor.          Basaglar  KwikPen 100 UNIT/ML Inject 15 Units into the skin at bedtime for 7 days, THEN 18 Units at bedtime. Start taking on: October 29, 2023   ibuprofen  200 MG tablet Commonly known as: ADVIL  Take 400 mg by mouth every 6 (six) hours as needed for headache.   metFORMIN  500 MG 24 hr tablet Commonly known as: GLUCOPHAGE -XR Take 2 tablets (1,000 mg total) by mouth 2 (two) times daily with a meal.         ALLERGIES: No Known Allergies   REVIEW OF SYSTEMS: A comprehensive ROS was conducted with the patient and is negative except as per HPI and below:  ROS    OBJECTIVE:   VITAL SIGNS: There were no vitals taken for this visit.   PHYSICAL EXAM:  General: Pt appears well and is in NAD  Neck: General: Supple without adenopathy or carotid bruits. Thyroid: Thyroid size normal.  No goiter or nodules appreciated.   Lungs: Clear with good BS bilat   Heart: RRR   Abdomen:  soft, nontender  Extremities:  Lower extremities - No pretibial edema.   Neuro: MS is good with appropriate affect, pt is alert and Ox3    DM foot exam:    DATA REVIEWED:  Lab Results  Component Value Date   HGBA1C 13.0 (A) 06/19/2022   HGBA1C 13.2 (A) 05/19/2022   HGBA1C 9.0 (A) 08/26/2021   Lab Results  Component Value Date   CREATININE 1.12 12/23/2023    Latest Reference Range & Units 12/23/23 21:59  Comprehensive metabolic panel with GFR  Rpt !!  Sodium 135 - 145 mmol/L 132 (L)   Potassium 3.5 - 5.1 mmol/L 4.5  Chloride 98 - 111 mmol/L 93 (L)  CO2 22 - 32 mmol/L 28  Glucose 70 - 99 mg/dL 432 (HH)  BUN 6 - 20 mg/dL 11  Creatinine 9.38 - 8.75 mg/dL 8.87  Calcium  8.9 - 10.3 mg/dL 9.6  Anion gap 5 - 15  11  Alkaline Phosphatase 38 - 126 U/L 124  Albumin 3.5 - 5.0 g/dL 4.0  AST 15 - 41 U/L 13 (L)  ALT 0 - 44 U/L 12  Total Protein 6.5 - 8.1 g/dL 6.7  Total Bilirubin 0.0 - 1.2 mg/dL 0.7  GFR, Estimated >39 mL/min >60    ASSESSMENT / PLAN / RECOMMENDATIONS:   1) Type *** Diabetes Mellitus, ***controlled, With*** complications - Most recent A1c of *** %. Goal A1c < *** %.  ***  Plan: GENERAL: ***  MEDICATIONS: ***  EDUCATION / INSTRUCTIONS: BG monitoring instructions: Patient is instructed to check his blood sugars *** times a day, ***. Call [facility] if: BG persistently < 70  I reviewed the Rule of 15 for the treatment of hypoglycemia in detail with the patient. Literature supplied.   2) Diabetic complications:  Eye: Does *** have known diabetic retinopathy.  Neuro/ Feet: Does *** have known diabetic peripheral neuropathy. Renal: Patient does *** have known baseline CKD. He is *** on an ACEI/ARB at present.  3) Lipids: Patient is *** on a statin.    4) Hypertension: ***  at goal of < 140/90 mmHg.       Signed electronically by: Stefano Redgie Butts, MD  [facility] Endocrinology  [facility] [address] [place] [place], [place] [address] Phone: [phone] FAX: [phone]   CC: Lorren Greig PARAS, NP [address] [place] [address] Phone: [phone]  Fax: [phone]    Return to Endocrinology clinic as below: Future Appointments  Date Time Provider Department Center  01/07/2024  2:00 PM Pete Schnitzer, Donell Redgie, MD [facility] None  01/17/2024 10:40 AM Lorren Greig PARAS, NP [facility] [address]

## 2024-01-10 ENCOUNTER — Ambulatory Visit: Payer: Self-pay

## 2024-01-10 MED ORDER — DOXYCYCLINE HYCLATE 100 MG PO TABS
100.0000 mg | ORAL_TABLET | Freq: Two times a day (BID) | ORAL | 0 refills | Status: AC
Start: 2024-01-10 — End: 2024-01-17

## 2024-01-17 ENCOUNTER — Encounter: Admitting: Family

## 2024-01-17 NOTE — Progress Notes (Signed)
 Erroneous encounter-disregard

## 2024-01-27 ENCOUNTER — Ambulatory Visit (INDEPENDENT_AMBULATORY_CARE_PROVIDER_SITE_OTHER): Admitting: Internal Medicine

## 2024-01-27 ENCOUNTER — Encounter: Payer: Self-pay | Admitting: Internal Medicine

## 2024-01-27 VITALS — BP 114/76 | HR 80 | Resp 16 | Ht 69.0 in | Wt 170.4 lb

## 2024-01-27 DIAGNOSIS — E1165 Type 2 diabetes mellitus with hyperglycemia: Secondary | ICD-10-CM

## 2024-01-27 DIAGNOSIS — Z794 Long term (current) use of insulin: Secondary | ICD-10-CM

## 2024-01-27 DIAGNOSIS — E119 Type 2 diabetes mellitus without complications: Secondary | ICD-10-CM

## 2024-01-27 LAB — POCT GLYCOSYLATED HEMOGLOBIN (HGB A1C): HbA1c POC (<> result, manual entry): >15 % (ref 4.0–5.6)

## 2024-01-27 MED ORDER — METFORMIN HCL ER 500 MG PO TB24: 1000.0000 mg | ORAL_TABLET | Freq: Two times a day (BID) | ORAL | 2 refills | Status: AC

## 2024-01-27 MED ORDER — INSULIN PEN NEEDLE 32G X 4 MM MISC: 1.0000 | Freq: Four times a day (QID) | 3 refills | Status: DC

## 2024-01-27 MED ORDER — FREESTYLE LIBRE 3 PLUS SENSOR MISC: 1.0000 | 3 refills | Status: AC

## 2024-01-27 MED ORDER — BASAGLAR KWIKPEN 100 UNIT/ML ~~LOC~~ SOPN: 20.0000 [IU] | PEN_INJECTOR | Freq: Every day | SUBCUTANEOUS | 2 refills | Status: DC

## 2024-01-27 MED ORDER — INSULIN LISPRO (1 UNIT DIAL) 100 UNIT/ML (KWIKPEN): 8.0000 [IU] | PEN_INJECTOR | Freq: Three times a day (TID) | SUBCUTANEOUS | 11 refills | Status: DC

## 2024-01-27 NOTE — Progress Notes (Signed)
 Name: Ricardo Boyer  MRN/ DOB: [id number], 1991/01/31   Age/ Sex: 33 y.o., male    PCP: Jaycee Greig PARAS, NP   Reason for Endocrinology Evaluation: Type 2 Diabetes Mellitus     Date of Initial Endocrinology Visit: 01/27/2024     PATIENT IDENTIFIER: Mr. Ricardo Boyer is a 33 y.o. male with a past medical history of DM, A-fib. The patient presented for initial endocrinology clinic visit on 01/27/2024 for consultative assistance with his diabetes management.    HPI: Mr. Naas was    Diagnosed with DM in 2023 Prior Medications tried/Intolerance: as listed  Currently checking blood sugars 1 x / day.  Hypoglycemia episodes : no              Hemoglobin A1c has ranged from 9.0% in 2023, peaking at 13.2% in 2024. Patient required assistance for hypoglycemia: no Patient has required hospitalization within the last 1 year from hyper or hypoglycemia: DKA 12/2023.  Patient has had multiple ED admissions for hyperglycemia including August, September as well  In terms of diet, the patient eats 2 meals a day . Snacks occasionally. 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     The patient was [facility] up with [facility]  No tingling or numbness of the feet but has in the hands No nausea or vomiting  No constipation or diarrhea unless he takes metformin  without food  Patient is complaining of polydipsia, polyuria, and weight loss  HOME DIABETES REGIMEN: Metformin  500 mg XR, 2 tabs  twice daily Basaglar  15 units daily   Statin: No ACE-I/ARB: No Prior Diabetic Education: Yes   METER DOWNLOAD SUMMARY: Date range evaluated: 11/6-11/19/2025 Average Number Tests/Day = 0.7 Overall Mean FS Glucose = 514 Standard Deviation = 90   Hypoglycemic Events/30 Days: BG < 50 = 0 Episodes of symptomatic severe hypoglycemia = 0   DIABETIC COMPLICATIONS: Microvascular complications:   Denies: CKD , neuropathy  Last eye exam:  Completed 2025  Macrovascular complications:   Denies: CAD, PVD, CVA   PAST HISTORY: Past Medical History:  Past Medical History:  Diagnosis Date   Diabetes mellitus without complication (HCC)    Dysrhythmia    atrial fibrilation   Gonorrhea    Past Surgical History: History reviewed. No pertinent surgical history.  Social History:  reports that he has been smoking cigarettes. He has a 1 pack-year smoking history. He has been exposed to tobacco smoke. He has never used smokeless tobacco. He reports that he does not drink alcohol and does not use drugs. Family History:  Family History  Problem Relation Age of Onset   Diabetes Mother      HOME MEDICATIONS: Allergies as of 01/27/2024   No Known Allergies      Medication List        Accurate as of January 27, 2024  8:48 AM. If you have any questions, ask your nurse or doctor.          Basaglar  KwikPen 100 UNIT/ML Inject 15 Units into the skin at bedtime for 7 days, THEN 18 Units at bedtime. Start taking on: October 29, 2023   ibuprofen  200 MG tablet Commonly known as: ADVIL  Take 400 mg by mouth every 6 (six) hours as needed for headache.   metFORMIN  500 MG 24 hr tablet Commonly known as: GLUCOPHAGE -XR Take 2 tablets (1,000 mg total) by mouth 2 (two) times daily with a meal.         ALLERGIES: No  Known Allergies   REVIEW OF SYSTEMS: A comprehensive ROS was conducted with the patient and is negative except as per HPI    OBJECTIVE:   VITAL SIGNS: BP 114/76   Pulse 80   Resp 16   Ht 5' 9 (1.753 m)   Wt 170 lb 6.4 oz (77.3 kg)   SpO2 95%   BMI 25.16 kg/m    PHYSICAL EXAM:  General: Pt appears well and is in NAD  Neck: General: Supple without adenopathy or carotid bruits. Thyroid: Thyroid size normal.  No goiter or nodules appreciated.   Lungs: Clear with good BS bilat   Heart: RRR   Abdomen:  soft, nontender  Extremities:  Lower extremities - No pretibial edema.   Neuro: MS is good with  appropriate affect, pt is alert and Ox3     DATA REVIEWED:  Lab Results  Component Value Date   HGBA1C 13.0 (A) 06/19/2022   HGBA1C 13.2 (A) 05/19/2022   HGBA1C 9.0 (A) 08/26/2021   Lab Results  Component Value Date   CREATININE 1.12 12/23/2023    Latest Reference Range & Units 12/23/23 21:59  Comprehensive metabolic panel with GFR  Rpt !!  Sodium 135 - 145 mmol/L 132 (L)  Potassium 3.5 - 5.1 mmol/L 4.5  Chloride 98 - 111 mmol/L 93 (L)  CO2 22 - 32 mmol/L 28  Glucose 70 - 99 mg/dL 432 (HH)  BUN 6 - 20 mg/dL 11  Creatinine 9.38 - 8.75 mg/dL 8.87  Calcium  8.9 - 10.3 mg/dL 9.6  Anion gap 5 - 15  11  Alkaline Phosphatase 38 - 126 U/L 124  Albumin 3.5 - 5.0 g/dL 4.0  AST 15 - 41 U/L 13 (L)  ALT 0 - 44 U/L 12  Total Protein 6.5 - 8.1 g/dL 6.7  Total Bilirubin 0.0 - 1.2 mg/dL 0.7  GFR, Estimated >39 mL/min >60    ASSESSMENT / PLAN / RECOMMENDATIONS:   1) Type 2 Diabetes Mellitus, poorly controlled, Without complications - Most recent A1c of 13.2 %. Goal A1c < 7.0 %.    Plan: GENERAL: - I have discussed with the patient the pathophysiology of diabetes. We went over the natural progression of the disease. We stressed the importance of lifestyle changes. I explained the complications associated with diabetes including retinopathy, nephropathy, neuropathy as well as increased risk of cardiovascular disease. We went over the benefit seen with glycemic control.   - I explained to the patient that diabetic patients are at higher than normal risk for amputations.   - Not a candidate for SGLT2 inhibitors due to history of DKA - Patient is not keen on weight loss - I have recommended starting prandial dose of insulin  to be taken with each meal - Patient was advised to avoid sugar sweetened beverages, I have also placed a referral to our CDE for education - A prescription for freestyle libre 3+ was sent to the pharmacy - Will also increase basal rate  MEDICATIONS: Continue  metformin  500 mg XR, 2 tabs twice daily Increase Basaglar  20 units daily Start Humalog 8 units with each meal  EDUCATION / INSTRUCTIONS: BG monitoring instructions: Patient is instructed to check his blood sugars 3 times a day. Call [facility] if: BG persistently < 70  I reviewed the Rule of 15 for the treatment of hypoglycemia in detail with the patient. Literature supplied.   2) Diabetic complications:  Eye: Does not have known diabetic retinopathy.  Neuro/ Feet: Does not have known diabetic peripheral  neuropathy. Renal: Patient does not have known baseline CKD. He is not on an ACEI/ARB at present.  Follow-up in 3 weeks   Signed electronically by: Stefano Redgie Butts, MD  [facility] Endocrinology  [facility] [address] [place], [place] [address] Phone: [phone] FAX: [phone]   CC: Jaycee Greig PARAS, NP [address] [place] [address] Phone: [phone]  Fax: [phone]    Return to Endocrinology clinic as below: No future appointments.

## 2024-01-27 NOTE — Patient Instructions (Addendum)
 Continue metformin  500 mg, 2 tablets with breakfast and 2 tablets with supper Increase Basaglar  20 units once daily Start Humalog  8 units with each meal   HOW TO TREAT LOW BLOOD SUGARS (Blood sugar LESS THAN 70 MG/DL) Please follow the RULE OF 15 for the treatment of hypoglycemia treatment (when your (blood sugars are less than 70 mg/dL)   STEP 1: Take 15 grams of carbohydrates when your blood sugar is low, which includes:  3-4 GLUCOSE TABS  OR 3-4 OZ OF JUICE OR REGULAR SODA OR ONE TUBE OF GLUCOSE GEL    STEP 2: RECHECK blood sugar in 15 MINUTES STEP 3: If your blood sugar is still low at the 15 minute recheck --> then, go back to STEP 1 and treat AGAIN with another 15 grams of carbohydrates.

## 2024-03-16 ENCOUNTER — Other Ambulatory Visit: Payer: Self-pay

## 2024-03-16 ENCOUNTER — Encounter: Payer: Self-pay | Attending: Family | Admitting: Dietician

## 2024-03-16 ENCOUNTER — Encounter: Payer: Self-pay | Admitting: Dietician

## 2024-03-16 ENCOUNTER — Telehealth: Payer: Self-pay | Admitting: Internal Medicine

## 2024-03-16 VITALS — Ht 69.5 in | Wt 166.0 lb

## 2024-03-16 DIAGNOSIS — E119 Type 2 diabetes mellitus without complications: Secondary | ICD-10-CM | POA: Diagnosis present

## 2024-03-16 NOTE — Patient Instructions (Addendum)
 20 units Basaglar  every morning.  If you have a low fasting blood glucose then decrease Basaglar  to 16 units 12 units Humalog  before each meal PLUS sliding scale below  Novolog  correctional insulin : ADD extra units on insulin  to your meal-time Novolog  dose if your blood sugars are higher than 160. Use the scale below to help guide you:    Blood sugar before meal Number of units to inject  Less than 160 0 unit  161 -  190 1 units  191 -  220 2 units  221 -  250 3 units  251 -  280 4 units  281 -  310 5 units  311 -  340 6 units  341 -  370 7 units  371 -  400 8 units  401 - 430 9 units    Treat low blood glucose less than 70 with   1/2 cup regular soda or juice  OR 4 glucose tabs

## 2024-03-16 NOTE — Telephone Encounter (Signed)
 Leita,  Thank you for letting me know about this patient's high glucose > 400 mg  I hope he starts Basaglar  as soon as possible  He may take NovoLog  12 units with each meal as a baseline plus correction scale as needed as below    Novolog  correctional insulin : ADD extra units on insulin  to your meal-time Novolog  dose if your blood sugars are higher than 160. Use the scale below to help guide you:   Blood sugar before meal Number of units to inject  Less than 160 0 unit  161 -  190 1 units  191 -  220 2 units  221 -  250 3 units  251 -  280 4 units  281 -  310 5 units  311 -  340 6 units  341 -  370 7 units  371 -  400 8 units  401 - 430 9 units

## 2024-03-16 NOTE — Progress Notes (Signed)
 "  Diabetes Self-Management Education  Visit Type: First/Initial  Appt. Start Time: 1100 Appt. End Time: 1200  03/16/2024  Mr. Ricardo Boyer, identified by name and date of birth, is a 34 y.o. male with a diagnosis of Diabetes: Type 2.   ASSESSMENT Patient is here today alone.  He was last seen by another diabetes educator 03/2023. Ran out of Basaglar  in November and has not taken since.   Checks his blood glucose daily.  Blood glucose was 492 this am and 417 in the office.  He took 15 units of Humalog  after breakfast this am at 8:30. Increased urination, increased thirst. Drinking more water  and less soda and caffeine.  Tries to drink diet soda but does drink regular soda.  Discussed with Donell Redgie Butts, MD Insulin  instructions received and copied to patient's AVS.  Patient verbalized that he needs to go to the pharmacy and pick up his Basaglar  after this visit.  Referral:  Type 2 Diabetes (Dr. Butts)  History includes:  Type 2 diabetes, a fib, smokes occasionally Medications include:  Basaglar  20 units per day (not taking), Humalog  8 units before each each meal but patient is taking 20 units, Metformin  Labs:  A1C >15% on 01/27/2024 Sleep:  4-5 hours per day.  69.5 166 lbs 03/16/2024 240 lbs 10/2023  Patient lives alone.  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 Takes his kids too and from school.  Limits alcohol to 2 servings occasionally. Exercise work related only.  Height 5' 9.5 (1.765 m), weight 166 lb (75.3 kg). Body mass index is 24.16 kg/m.   Diabetes Self-Management Education - 03/16/24 1125       Visit Information   Visit Type First/Initial      Initial Visit   Diabetes Type Type 2    Date Diagnosed 2023    Are you currently following a meal plan? No    Are you taking your medications as prescribed? No      Health Coping   How would you rate your overall health? Good      Psychosocial Assessment   Patient Belief/Attitude about  Diabetes Afraid   know i can flourish with it   What is the hardest part about your diabetes right now, causing you the most concern, or is the most worrisome to you about your diabetes?   Taking/obtaining medications;Making healty food and beverage choices    Self-care barriers None    Self-management support Doctor's office    Other persons present Patient    Patient Concerns Nutrition/Meal planning;Glycemic Control    Special Needs None    Preferred Learning Style No preference indicated    Learning Readiness Ready    How often do you need to have someone help you when you read instructions, pamphlets, or other written materials from your doctor or pharmacy? 1 - Never    What is the last grade level you completed in school? some college      Pre-Education Assessment   Patient understands the diabetes disease and treatment process. Needs Instruction    Patient understands incorporating nutritional management into lifestyle. Needs Instruction    Patient undertands incorporating physical activity into lifestyle. Needs Instruction    Patient understands using medications safely. Needs Instruction    Patient understands monitoring blood glucose, interpreting and using results Needs Instruction    Patient understands prevention, detection, and treatment of acute complications. Needs Instruction    Patient understands prevention, detection, and treatment of chronic complications. Needs Instruction  Patient understands how to develop strategies to address psychosocial issues. Needs Instruction    Patient understands how to develop strategies to promote health/change behavior. Needs Instruction      Complications   Last HgB A1C per patient/outside source 15 %   >15 01/2024   How often do you check your blood sugar? 1-2 times/day    Fasting Blood glucose range (mg/dL) >799    Postprandial Blood glucose range (mg/dL) >799    Number of hypoglycemic episodes per month 0    Have you had a  dilated eye exam in the past 12 months? Yes    Have you had a dental exam in the past 12 months? No    Are you checking your feet? Yes      Dietary Intake   Breakfast eggs, 1 slice toast (dry)    Lunch chicken wings, rice    Snack (afternoon) none    Dinner none    Snack (evening) none    Beverage(s) water , regular soda, diet soda, half and half unsweet tea, occasional alcohol      Activity / Exercise   Activity / Exercise Type Light (walking / raking leaves)    How many days per week do you exercise? 5    How many minutes per day do you exercise? 60    Total minutes per week of exercise 300      Patient Education   Previous Diabetes Education Yes   03/2023   Disease Pathophysiology Definition of diabetes, type 1 and 2, and the diagnosis of diabetes;Explored patient's options for treatment of their diabetes    Healthy Eating Role of diet in the treatment of diabetes and the relationship between the three main macronutrients and blood glucose level;Plate Method    Being Active Role of exercise on diabetes management, blood pressure control and cardiac health.    Medications Reviewed patients medication for diabetes, action, purpose, timing of dose and side effects.    Monitoring Taught/evaluated SMBG meter.;Identified appropriate SMBG and/or A1C goals.;Daily foot exams;Ketone testing, when, how.;Yearly dilated eye exam;Purpose and frequency of SMBG.    Acute complications Taught prevention, symptoms, and  treatment of hypoglycemia - the 15 rule.;Discussed and identified patients' prevention, symptoms, and treatment of hyperglycemia.    Chronic complications Relationship between chronic complications and blood glucose control    Diabetes Stress and Support Identified and addressed patients feelings and concerns about diabetes;Worked with patient to identify barriers to care and solutions;Role of stress on diabetes      Individualized Goals (developed by patient)   Nutrition General  guidelines for healthy choices and portions discussed    Physical Activity Exercise 5-7 days per week;60 minutes per day    Medications take my medication as prescribed    Monitoring  Test my blood glucose as discussed    Problem Solving Eating Pattern;Sleep Pattern;Medication consistency;Addressing barriers to behavior change    Reducing Risk examine blood glucose patterns;do foot checks daily;stop smoking;treat hypoglycemia with 15 grams of carbs if blood glucose less than 70mg /dL;check ketones if blood glucose over 240mg /dL    Health Coping Ask for help with psychological, social, or emotional issues      Post-Education Assessment   Patient understands the diabetes disease and treatment process. Comprehends key points    Patient understands incorporating nutritional management into lifestyle. Needs Review    Patient undertands incorporating physical activity into lifestyle. Comprehends key points    Patient understands using medications safely. Needs Review    Patient understands  monitoring blood glucose, interpreting and using results Comprehends key points    Patient understands prevention, detection, and treatment of acute complications. Comprehends key points    Patient understands prevention, detection, and treatment of chronic complications. Comprehends key points    Patient understands how to develop strategies to address psychosocial issues. Comprehends key points    Patient understands how to develop strategies to promote health/change behavior. Needs Review      Outcomes   Expected Outcomes Demonstrated interest in learning. Expect positive outcomes    Future DMSE 4-6 wks    Program Status Not Completed          Individualized Plan for Diabetes Self-Management Training:   Learning Objective:  Patient will have a greater understanding of diabetes self-management. Patient education plan is to attend individual and/or group sessions per assessed needs and concerns.   Plan:    Patient Instructions  20 units Basaglar  every morning.  If you have a low fasting blood glucose then decrease Basaglar  to 16 units 12 units Humalog  before each meal PLUS sliding scale below  Novolog  correctional insulin : ADD extra units on insulin  to your meal-time Novolog  dose if your blood sugars are higher than 160. Use the scale below to help guide you:    Blood sugar before meal Number of units to inject  Less than 160 0 unit  161 -  190 1 units  191 -  220 2 units  221 -  250 3 units  251 -  280 4 units  281 -  310 5 units  311 -  340 6 units  341 -  370 7 units  371 -  400 8 units  401 - 430 9 units    Treat low blood glucose less than 70 with   1/2 cup regular soda or juice  OR 4 glucose tabs  Expected Outcomes:  Demonstrated interest in learning. Expect positive outcomes  Education material provided: ADA - How to Thrive: A Guide for Your Journey with Diabetes  If problems or questions, patient to contact team via:  Phone  Future DSME appointment: 4-6 wks "

## 2024-03-25 ENCOUNTER — Other Ambulatory Visit: Payer: Self-pay

## 2024-03-25 ENCOUNTER — Emergency Department (HOSPITAL_COMMUNITY): Admission: EM | Admit: 2024-03-25 | Discharge: 2024-03-25 | Disposition: A | Discharge status: Home / Self Care

## 2024-03-25 ENCOUNTER — Encounter (HOSPITAL_COMMUNITY): Payer: Self-pay | Admitting: Emergency Medicine

## 2024-03-25 DIAGNOSIS — Z7984 Long term (current) use of oral hypoglycemic drugs: Secondary | ICD-10-CM | POA: Insufficient documentation

## 2024-03-25 DIAGNOSIS — Z794 Long term (current) use of insulin: Secondary | ICD-10-CM | POA: Insufficient documentation

## 2024-03-25 DIAGNOSIS — E871 Hypo-osmolality and hyponatremia: Secondary | ICD-10-CM | POA: Diagnosis not present

## 2024-03-25 DIAGNOSIS — E1165 Type 2 diabetes mellitus with hyperglycemia: Secondary | ICD-10-CM | POA: Diagnosis not present

## 2024-03-25 DIAGNOSIS — R739 Hyperglycemia, unspecified: Secondary | ICD-10-CM | POA: Diagnosis present

## 2024-03-25 LAB — I-STAT VENOUS BLOOD GAS, ED
Acid-Base Excess: 0 mmol/L (ref 0.0–2.0)
Bicarbonate: 24.2 mmol/L (ref 20.0–28.0)
Calcium, Ion: 1.11 mmol/L — ABNORMAL LOW (ref 1.15–1.40)
HCT: 46 % (ref 39.0–52.0)
Hemoglobin: 15.6 g/dL (ref 13.0–17.0)
O2 Saturation: 97 %
Potassium: 4.1 mmol/L (ref 3.5–5.1)
Sodium: 130 mmol/L — ABNORMAL LOW (ref 135–145)
TCO2: 25 mmol/L (ref 22–32)
pCO2, Ven: 37.1 mmHg — ABNORMAL LOW (ref 44–60)
pH, Ven: 7.422 (ref 7.25–7.43)
pO2, Ven: 86 mmHg — ABNORMAL HIGH (ref 32–45)

## 2024-03-25 LAB — BASIC METABOLIC PANEL WITH GFR
Anion gap: 16 — ABNORMAL HIGH (ref 5–15)
Anion gap: 14 (ref 5–15)
BUN: 19 mg/dL (ref 6–20)
BUN: 13 mg/dL (ref 6–20)
CO2: 21 mmol/L — ABNORMAL LOW (ref 22–32)
CO2: 21 mmol/L — ABNORMAL LOW (ref 22–32)
Calcium: 9.2 mg/dL (ref 8.9–10.3)
Calcium: 8.8 mg/dL — ABNORMAL LOW (ref 8.9–10.3)
Chloride: 92 mmol/L — ABNORMAL LOW (ref 98–111)
Chloride: 99 mmol/L (ref 98–111)
Creatinine, Ser: 1.05 mg/dL (ref 0.61–1.24)
Creatinine, Ser: 0.84 mg/dL (ref 0.61–1.24)
GFR, Estimated: >60 mL/min
GFR, Estimated: >60 mL/min
Glucose, Bld: 669 mg/dL (ref 70–99)
Glucose, Bld: 325 mg/dL — ABNORMAL HIGH (ref 70–99)
Potassium: 4.7 mmol/L (ref 3.5–5.1)
Potassium: 4.3 mmol/L (ref 3.5–5.1)
Sodium: 128 mmol/L — ABNORMAL LOW (ref 135–145)
Sodium: 134 mmol/L — ABNORMAL LOW (ref 135–145)

## 2024-03-25 LAB — URINALYSIS, ROUTINE W REFLEX MICROSCOPIC
Bacteria, UA: NONE SEEN
Bilirubin Urine: NEGATIVE
Glucose, UA: >=500 mg/dL — AB
Hgb urine dipstick: NEGATIVE
Ketones, ur: 20 mg/dL — AB
Leukocytes,Ua: NEGATIVE
Nitrite: NEGATIVE
Protein, ur: NEGATIVE mg/dL
Specific Gravity, Urine: 1.024 (ref 1.005–1.030)
pH: 6 (ref 5.0–8.0)

## 2024-03-25 LAB — I-STAT CHEM 8, ED
BUN: 22 mg/dL — ABNORMAL HIGH (ref 6–20)
Calcium, Ion: 1.14 mmol/L — ABNORMAL LOW (ref 1.15–1.40)
Chloride: 97 mmol/L — ABNORMAL LOW (ref 98–111)
Creatinine, Ser: 0.8 mg/dL (ref 0.61–1.24)
Glucose, Bld: 660 mg/dL (ref 70–99)
HCT: 48 % (ref 39.0–52.0)
Hemoglobin: 16.3 g/dL (ref 13.0–17.0)
Potassium: 4.1 mmol/L (ref 3.5–5.1)
Sodium: 132 mmol/L — ABNORMAL LOW (ref 135–145)
TCO2: 23 mmol/L (ref 22–32)

## 2024-03-25 LAB — CBC
HCT: 47.4 % (ref 39.0–52.0)
Hemoglobin: 16.5 g/dL (ref 13.0–17.0)
MCH: 31 pg (ref 26.0–34.0)
MCHC: 34.8 g/dL (ref 30.0–36.0)
MCV: 89.1 fL (ref 80.0–100.0)
Platelets: 186 K/uL (ref 150–400)
RBC: 5.32 MIL/uL (ref 4.22–5.81)
RDW: 11.9 % (ref 11.5–15.5)
WBC: 6.8 K/uL (ref 4.0–10.5)
nRBC: 0 % (ref 0.0–0.2)

## 2024-03-25 LAB — CBG MONITORING, ED
Glucose-Capillary: 480 mg/dL — ABNORMAL HIGH (ref 70–99)
Glucose-Capillary: >600 mg/dL (ref 70–99)

## 2024-03-25 LAB — BETA-HYDROXYBUTYRIC ACID: Beta-Hydroxybutyric Acid: 2.27 mmol/L — ABNORMAL HIGH (ref 0.05–0.27)

## 2024-03-25 MED ORDER — SODIUM CHLORIDE 0.9 % IV BOLUS
1000.0000 mL | Freq: Once | INTRAVENOUS | Status: AC
  Administered 2024-03-25: 1000 mL via INTRAVENOUS

## 2024-03-25 MED ORDER — INSULIN ASPART 100 UNIT/ML IJ SOLN: 8.0000 [IU] | Freq: Three times a day (TID) | INTRAMUSCULAR | Status: DC

## 2024-03-25 MED ORDER — INSULIN ASPART 100 UNIT/ML IJ SOLN
8.0000 [IU] | Freq: Once | INTRAMUSCULAR | Status: AC
  Administered 2024-03-25: 8 [IU] via SUBCUTANEOUS
  Filled 2024-03-25: qty 8

## 2024-03-25 NOTE — ED Triage Notes (Signed)
 Patient coming to ED for evaluation of hyperglycemia.  Reports he has been out of his insulin  for 2 or 3 weeks.  CBG at home was ready high.  Reports no hx of DKA.  C/o increased thirst.

## 2024-03-25 NOTE — Discharge Instructions (Signed)
 Today you were seen for hyperglycemia.  Please take your diabetes medications as prescribed.  Thank you for letting us  treat you today. After reviewing your labs, I feel you are safe to go home. Please follow up with your PCP in the next several days and provide them with your records from this visit. Return to the Emergency Room if pain becomes severe or symptoms worsen.

## 2024-03-25 NOTE — Care Management (Addendum)
 Patent DC earlier this am,   medication assistance consult  he has insurance therefore is not eligible for [facility]

## 2024-03-25 NOTE — ED Provider Notes (Signed)
 " [place] [facility] Provider Note   CSN: [id number] Arrival date & time: 03/25/24  9973     Patient presents with: Hyperglycemia   Ladarrius Bogdanski is a 34 y.o. male.  Presents today for hyperglycemia.  Patient has been out of his insulin  for 2 or 3 weeks.  Patient's glucometer at home read as high.  Patient denies history of DKA.  Patient denies nausea, vomiting, fever, chills, or abdominal pain.  Patient endorses polydipsia and polyuria.    Hyperglycemia Associated symptoms: increased thirst and polyuria        Prior to Admission medications  Medication Sig Start Date End Date Taking? Authorizing Provider  Continuous Glucose Sensor (FREESTYLE LIBRE 3 PLUS SENSOR) MISC 1 Device by Other route every 14 (fourteen) days. Change sensor every 15 days. Patient not taking: Reported on 03/16/2024 01/27/24   Shamleffer, Ibtehal Jaralla, MD  ibuprofen  (ADVIL ) 200 MG tablet Take 400 mg by mouth every 6 (six) hours as needed for headache.    [provider]  Insulin  Glargine (BASAGLAR  KWIKPEN) 100 UNIT/ML Inject 20 Units into the skin daily. Patient not taking: Reported on 03/16/2024 01/27/24   Shamleffer, Ibtehal Jaralla, MD  insulin  lispro (HUMALOG  KWIKPEN) 100 UNIT/ML KwikPen Inject 8 Units into the skin 3 (three) times daily. 01/27/24   Shamleffer, Ibtehal Jaralla, MD  Insulin  Pen Needle 32G X 4 MM MISC 1 Device by Does not apply route in the morning, at noon, in the evening, and at bedtime. 01/27/24   Shamleffer, Ibtehal Jaralla, MD  metFORMIN  (GLUCOPHAGE -XR) 500 MG 24 hr tablet Take 2 tablets (1,000 mg total) by mouth 2 (two) times daily with a meal. 01/27/24   Shamleffer, Donell Cardinal, MD  diltiazem  (CARDIZEM ) 30 MG tablet Take 1 tablet (30 mg total) by mouth as needed (for palpitations/ fast heart rates). 06/24/13 12/08/19  Marcine Caffie HERO, PA-C    Allergies: Patient has no known allergies.    Review of Systems  Endocrine: Positive for  polydipsia and polyuria.    Updated Vital Signs BP 120/74 (BP Location: Right Arm)   Pulse 96   Temp 97.9 F (36.6 C) (Oral)   Resp 18   Ht 5' 9.5 (1.765 m)   Wt 75.3 kg   SpO2 97%   BMI 24.16 kg/m   Physical Exam Vitals and nursing note reviewed.  Constitutional:      General: He is not in acute distress.    Appearance: He is well-developed. He is not toxic-appearing.  HENT:     Head: Normocephalic and atraumatic.     Right Ear: External ear normal.     Left Ear: External ear normal.     Nose: Nose normal.  Eyes:     Conjunctiva/sclera: Conjunctivae normal.  Cardiovascular:     Rate and Rhythm: Normal rate and regular rhythm.     Pulses: Normal pulses.     Heart sounds: Normal heart sounds. No murmur heard. Pulmonary:     Effort: Pulmonary effort is normal. No respiratory distress.     Breath sounds: Normal breath sounds.  Abdominal:     General: There is no distension.     Palpations: Abdomen is soft.     Tenderness: There is no abdominal tenderness.  Musculoskeletal:        General: No swelling.     Cervical back: Neck supple.  Skin:    General: Skin is warm and dry.     Capillary Refill: Capillary refill takes less than  2 seconds.  Neurological:     General: No focal deficit present.     Mental Status: He is alert and oriented to person, place, and time.  Psychiatric:        Mood and Affect: Mood normal.     (all labs ordered are listed, but only abnormal results are displayed) Labs Reviewed  URINALYSIS, ROUTINE W REFLEX MICROSCOPIC - Abnormal; Notable for the following components:      Result Value   Color, Urine COLORLESS (*)    Glucose, UA >=500 (*)    Ketones, ur 20 (*)    All other components within normal limits  BASIC METABOLIC PANEL WITH GFR - Abnormal; Notable for the following components:   Sodium 128 (*)    Chloride 92 (*)    CO2 21 (*)    Glucose, Bld 669 (*)    Anion gap 16 (*)    All other components within normal limits   BETA-HYDROXYBUTYRIC ACID - Abnormal; Notable for the following components:   Beta-Hydroxybutyric Acid 2.27 (*)    All other components within normal limits  BASIC METABOLIC PANEL WITH GFR - Abnormal; Notable for the following components:   Sodium 134 (*)    CO2 21 (*)    Glucose, Bld 325 (*)    Calcium  8.8 (*)    All other components within normal limits  CBG MONITORING, ED - Abnormal; Notable for the following components:   Glucose-Capillary >600 (*)    All other components within normal limits  I-STAT VENOUS BLOOD GAS, ED - Abnormal; Notable for the following components:   pCO2, Ven 37.1 (*)    pO2, Ven 86 (*)    Sodium 130 (*)    Calcium , Ion 1.11 (*)    All other components within normal limits  I-STAT CHEM 8, ED - Abnormal; Notable for the following components:   Sodium 132 (*)    Chloride 97 (*)    BUN 22 (*)    Glucose, Bld 660 (*)    Calcium , Ion 1.14 (*)    All other components within normal limits  CBG MONITORING, ED - Abnormal; Notable for the following components:   Glucose-Capillary 480 (*)    All other components within normal limits  CBC  CBG MONITORING, ED    EKG: None  Radiology: No results found.   Procedures   Medications Ordered in the ED  sodium chloride  0.9 % bolus 1,000 mL (0 mLs Intravenous Stopped 03/25/24 0230)  insulin  aspart (novoLOG ) injection 8 Units (8 Units Subcutaneous Given 03/25/24 0239)  sodium chloride  0.9 % bolus 1,000 mL (1,000 mLs Intravenous New Bag/Given 03/25/24 0517)                                    Medical Decision Making Amount and/or Complexity of Data Reviewed Labs: ordered.  Risk Prescription drug management.   This patient presents to the ED for concern of hyperglycemia differential diagnosis includes hyperglycemia, HHS, DKA    Lab Tests:  I Ordered, and personally interpreted labs.  The pertinent results include: Glucose 669, VBG with mildly decreased pCO2 at 37.1, elevated PO2 at 86, CBC unremarkable,  UA with greater than 500 glucose, 20 ketones, hyponatremia 128 corrected to 137 in the setting of hyperglycemia, anion gap 16, reduced CO2 at 21, reduced chloride at 92, elevated beta hydroxy at 2.27 Repeat BMP showed a glucose of 325 and normal anion gap  Medicines ordered and prescription drug management:  I ordered medication including IVF and insulin     I have reviewed the patients home medicines and have made adjustments as needed   Problem List / ED Course:  Considered for admission or further workup however patient's vital signs, physical exam, and labs are reassuring.  I do not believe patient is in DKA at this time.  Transition of care consulted in regards to medication assistance.  Patient advised to follow-up with his PCP for further evaluation and workup as needed.  Patient given return precautions.  I feel patient is safe for discharge at this time.      Final diagnoses:  Hyperglycemia    ED Discharge Orders     None          Francis Ileana SAILOR, PA-C 03/25/24 0556    Simon Lavonia SAILOR, MD 03/25/24 [phone]  "

## 2024-03-29 ENCOUNTER — Observation Stay (HOSPITAL_COMMUNITY)
Admission: EM | Admit: 2024-03-29 | Discharge: 2024-03-30 | Disposition: A | Source: Home / Self Care | Attending: Emergency Medicine | Admitting: Emergency Medicine

## 2024-03-29 ENCOUNTER — Other Ambulatory Visit: Payer: Self-pay

## 2024-03-29 ENCOUNTER — Emergency Department (HOSPITAL_COMMUNITY)

## 2024-03-29 DIAGNOSIS — R109 Unspecified abdominal pain: Secondary | ICD-10-CM | POA: Diagnosis present

## 2024-03-29 DIAGNOSIS — I480 Paroxysmal atrial fibrillation: Secondary | ICD-10-CM | POA: Insufficient documentation

## 2024-03-29 DIAGNOSIS — E871 Hypo-osmolality and hyponatremia: Secondary | ICD-10-CM | POA: Insufficient documentation

## 2024-03-29 DIAGNOSIS — E1110 Type 2 diabetes mellitus with ketoacidosis without coma: Principal | ICD-10-CM | POA: Insufficient documentation

## 2024-03-29 DIAGNOSIS — Z79899 Other long term (current) drug therapy: Secondary | ICD-10-CM | POA: Insufficient documentation

## 2024-03-29 DIAGNOSIS — D750 Familial erythrocytosis: Secondary | ICD-10-CM | POA: Insufficient documentation

## 2024-03-29 DIAGNOSIS — Z7984 Long term (current) use of oral hypoglycemic drugs: Secondary | ICD-10-CM | POA: Diagnosis not present

## 2024-03-29 DIAGNOSIS — F17210 Nicotine dependence, cigarettes, uncomplicated: Secondary | ICD-10-CM | POA: Diagnosis not present

## 2024-03-29 DIAGNOSIS — E1165 Type 2 diabetes mellitus with hyperglycemia: Secondary | ICD-10-CM | POA: Insufficient documentation

## 2024-03-29 DIAGNOSIS — E875 Hyperkalemia: Secondary | ICD-10-CM | POA: Insufficient documentation

## 2024-03-29 DIAGNOSIS — Z794 Long term (current) use of insulin: Secondary | ICD-10-CM | POA: Diagnosis not present

## 2024-03-29 LAB — URINALYSIS, ROUTINE W REFLEX MICROSCOPIC
Bilirubin Urine: NEGATIVE
Glucose, UA: >=500 mg/dL — AB
Hgb urine dipstick: NEGATIVE
Ketones, ur: 80 mg/dL — AB
Leukocytes,Ua: NEGATIVE
Nitrite: NEGATIVE
Protein, ur: 100 mg/dL — AB
Specific Gravity, Urine: 1.026 (ref 1.005–1.030)
pH: 5 (ref 5.0–8.0)

## 2024-03-29 LAB — COMPREHENSIVE METABOLIC PANEL WITH GFR
ALT: 27 U/L (ref 0–44)
AST: 21 U/L (ref 15–41)
Albumin: 4.7 g/dL (ref 3.5–5.0)
Alkaline Phosphatase: 129 U/L — ABNORMAL HIGH (ref 38–126)
Anion gap: 27 — ABNORMAL HIGH (ref 5–15)
BUN: 13 mg/dL (ref 6–20)
CO2: 14 mmol/L — ABNORMAL LOW (ref 22–32)
Calcium: 9.9 mg/dL (ref 8.9–10.3)
Chloride: 92 mmol/L — ABNORMAL LOW (ref 98–111)
Creatinine, Ser: 1.21 mg/dL (ref 0.61–1.24)
GFR, Estimated: >60 mL/min
Glucose, Bld: 345 mg/dL — ABNORMAL HIGH (ref 70–99)
Potassium: 5.4 mmol/L — ABNORMAL HIGH (ref 3.5–5.1)
Sodium: 134 mmol/L — ABNORMAL LOW (ref 135–145)
Total Bilirubin: 0.5 mg/dL (ref 0.0–1.2)
Total Protein: 8.2 g/dL — ABNORMAL HIGH (ref 6.5–8.1)

## 2024-03-29 LAB — I-STAT CHEM 8, ED
BUN: 18 mg/dL (ref 6–20)
BUN: 14 mg/dL (ref 6–20)
Calcium, Ion: 1 mmol/L — ABNORMAL LOW (ref 1.15–1.40)
Calcium, Ion: 1.14 mmol/L — ABNORMAL LOW (ref 1.15–1.40)
Chloride: 102 mmol/L (ref 98–111)
Chloride: 101 mmol/L (ref 98–111)
Creatinine, Ser: 0.8 mg/dL (ref 0.61–1.24)
Creatinine, Ser: 0.7 mg/dL (ref 0.61–1.24)
Glucose, Bld: 349 mg/dL — ABNORMAL HIGH (ref 70–99)
Glucose, Bld: 227 mg/dL — ABNORMAL HIGH (ref 70–99)
HCT: 55 % — ABNORMAL HIGH (ref 39.0–52.0)
HCT: 52 % (ref 39.0–52.0)
Hemoglobin: 18.7 g/dL — ABNORMAL HIGH (ref 13.0–17.0)
Hemoglobin: 17.7 g/dL — ABNORMAL HIGH (ref 13.0–17.0)
Potassium: 8.2 mmol/L (ref 3.5–5.1)
Potassium: 4.7 mmol/L (ref 3.5–5.1)
Sodium: 129 mmol/L — ABNORMAL LOW (ref 135–145)
Sodium: 135 mmol/L (ref 135–145)
TCO2: 17 mmol/L — ABNORMAL LOW (ref 22–32)
TCO2: 18 mmol/L — ABNORMAL LOW (ref 22–32)

## 2024-03-29 LAB — CBG MONITORING, ED
Glucose-Capillary: 302 mg/dL — ABNORMAL HIGH (ref 70–99)
Glucose-Capillary: 165 mg/dL — ABNORMAL HIGH (ref 70–99)
Glucose-Capillary: 154 mg/dL — ABNORMAL HIGH (ref 70–99)
Glucose-Capillary: 165 mg/dL — ABNORMAL HIGH (ref 70–99)

## 2024-03-29 LAB — CBC
HCT: 56.5 % — ABNORMAL HIGH (ref 39.0–52.0)
Hemoglobin: 19.1 g/dL — ABNORMAL HIGH (ref 13.0–17.0)
MCH: 30.8 pg (ref 26.0–34.0)
MCHC: 33.8 g/dL (ref 30.0–36.0)
MCV: 91 fL (ref 80.0–100.0)
Platelets: 213 K/uL (ref 150–400)
RBC: 6.21 MIL/uL — ABNORMAL HIGH (ref 4.22–5.81)
RDW: 12.2 % (ref 11.5–15.5)
WBC: 5.3 K/uL (ref 4.0–10.5)
nRBC: 0 % (ref 0.0–0.2)

## 2024-03-29 LAB — BETA-HYDROXYBUTYRIC ACID: Beta-Hydroxybutyric Acid: 6.84 mmol/L — ABNORMAL HIGH (ref 0.05–0.27)

## 2024-03-29 LAB — I-STAT CG4 LACTIC ACID, ED: Lactic Acid, Venous: 2 mmol/L (ref 0.5–1.9)

## 2024-03-29 LAB — LIPASE, BLOOD: Lipase: 24 U/L (ref 11–51)

## 2024-03-29 MED ORDER — ACETAMINOPHEN 325 MG PO TABS: 650.0000 mg | ORAL_TABLET | Freq: Four times a day (QID) | ORAL | Status: DC | PRN

## 2024-03-29 MED ORDER — NICOTINE 7 MG/24HR TD PT24
7.0000 mg | MEDICATED_PATCH | Freq: Every day | TRANSDERMAL | Status: DC
  Filled 2024-03-29 (×2): qty 1

## 2024-03-29 MED ORDER — DEXTROSE 50 % IV SOLN: 0.0000 mL | INTRAVENOUS | Status: DC | PRN

## 2024-03-29 MED ORDER — INSULIN REGULAR(HUMAN) IN NACL 100-0.9 UT/100ML-% IV SOLN
INTRAVENOUS | Status: DC
  Administered 2024-03-29: 15 [IU]/h via INTRAVENOUS
  Filled 2024-03-29: qty 100

## 2024-03-29 MED ORDER — ONDANSETRON HCL 4 MG/2ML IJ SOLN: 4.0000 mg | Freq: Four times a day (QID) | INTRAMUSCULAR | Status: DC | PRN

## 2024-03-29 MED ORDER — DEXTROSE IN LACTATED RINGERS 5 % IV SOLN: INTRAVENOUS | Status: DC

## 2024-03-29 MED ORDER — LACTATED RINGERS IV SOLN: INTRAVENOUS | Status: DC

## 2024-03-29 MED ORDER — ONDANSETRON 4 MG PO TBDP
4.0000 mg | ORAL_TABLET | Freq: Once | ORAL | Status: AC
  Administered 2024-03-29: 4 mg via ORAL
  Filled 2024-03-29: qty 1

## 2024-03-29 MED ORDER — IOHEXOL 350 MG/ML SOLN
100.0000 mL | Freq: Once | INTRAVENOUS | Status: AC | PRN
  Administered 2024-03-29: 100 mL via INTRAVENOUS

## 2024-03-29 MED ORDER — ENOXAPARIN SODIUM 40 MG/0.4ML IJ SOSY
40.0000 mg | PREFILLED_SYRINGE | INTRAMUSCULAR | Status: DC
  Filled 2024-03-29: qty 0.4

## 2024-03-29 MED ORDER — LACTATED RINGERS IV BOLUS
20.0000 mL/kg | Freq: Once | INTRAVENOUS | Status: AC
  Administered 2024-03-29: 1506 mL via INTRAVENOUS

## 2024-03-29 MED ORDER — ACETAMINOPHEN 650 MG RE SUPP: 650.0000 mg | Freq: Four times a day (QID) | RECTAL | Status: DC | PRN

## 2024-03-29 MED ORDER — ONDANSETRON HCL 4 MG PO TABS: 4.0000 mg | ORAL_TABLET | Freq: Four times a day (QID) | ORAL | Status: DC | PRN

## 2024-03-29 NOTE — ED Triage Notes (Signed)
 Pt. Stated, Jesus had a stomach ache with nausea since Monday. I feel dehydrated.

## 2024-03-29 NOTE — ED Provider Triage Note (Signed)
 Emergency Medicine Provider Triage Evaluation Note  CURRY SEEFELDT III , a 34 y.o. male  was evaluated in triage.  Pt complains of epigastric abdominal pain and vomiting and nausea x 3 days no history of the same.  Patient denies alcohol abuse, use of NSAIDs.  Pain is significant.  He denies fevers diarrhea history of abdominal surgeries..  Review of Systems  Positive: nausea Negative: fever  Physical Exam  BP (!) 141/99 (BP Location: Right Arm)   Pulse 95   Temp (!) 97.3 F (36.3 C)   Resp 17   SpO2 98%  Gen:   Awake, no distress   Resp:  Normal effort  MSK:   Moves extremities without difficulty  Other:  Tender to palpation epigastric and left upper quadrant of the abdomen  Medical Decision Making  Medically screening exam initiated at 2:31 PM.  Appropriate orders placed.  Legrand LELON Fabian III was informed that the remainder of the evaluation will be completed by another provider, this initial triage assessment does not replace that evaluation, and the importance of remaining in the ED until their evaluation is complete.     Arloa Chroman, PA-C 03/29/24 1433

## 2024-03-29 NOTE — ED Provider Notes (Signed)
 " [place] EMERGENCY DEPARTMENT AT [facility] Provider Note   CSN: [id number] Arrival date & time: 03/29/24  1343     Patient presents with: Abdominal Pain and Nausea   Ricardo Boyer is a 34 y.o. male.   The history is provided by the patient, the EMS personnel and medical records. No language interpreter was used.  Abdominal Pain    34 year old male with history of diabetes, atrial fibrillation not on any anticoagulant who presents with complaint of abdominal pain.  Patient states for the past 3 days he has had diffuse abdominal discomfort.  He also endorses significant nausea without vomiting or diarrhea.  He states he feels dehydrated.  Endorses increased thirst and increased urination.  He does not endorse any fever or chills no burning on urination and no bowel changes.  Does use tobacco.  He mention he have not received a refill of his insulin  for several weeks.  He was recently seen in the hospital several days ago for elevated blood sugar and stay for 1 day.  States he did not get discharged with insulin  and still without it.  Patient denies alcohol use.  Prior to Admission medications  Medication Sig Start Date End Date Taking? Authorizing Provider  Continuous Glucose Sensor (FREESTYLE LIBRE 3 PLUS SENSOR) MISC 1 Device by Other route every 14 (fourteen) days. Change sensor every 15 days. Patient not taking: Reported on 03/16/2024 01/27/24   Shamleffer, Ibtehal Jaralla, MD  ibuprofen  (ADVIL ) 200 MG tablet Take 400 mg by mouth every 6 (six) hours as needed for headache.    [provider]  Insulin  Glargine (BASAGLAR  KWIKPEN) 100 UNIT/ML Inject 20 Units into the skin daily. Patient not taking: Reported on 03/16/2024 01/27/24   Shamleffer, Ibtehal Jaralla, MD  insulin  lispro (HUMALOG  KWIKPEN) 100 UNIT/ML KwikPen Inject 8 Units into the skin 3 (three) times daily. 01/27/24   Shamleffer, Ibtehal Jaralla, MD  Insulin  Pen Needle 32G X 4 MM MISC 1 Device by Does  not apply route in the morning, at noon, in the evening, and at bedtime. 01/27/24   Shamleffer, Ibtehal Jaralla, MD  metFORMIN  (GLUCOPHAGE -XR) 500 MG 24 hr tablet Take 2 tablets (1,000 mg total) by mouth 2 (two) times daily with a meal. 01/27/24   Shamleffer, Donell Cardinal, MD  diltiazem  (CARDIZEM ) 30 MG tablet Take 1 tablet (30 mg total) by mouth as needed (for palpitations/ fast heart rates). 06/24/13 12/08/19  Marcine Caffie HERO, PA-C    Allergies: Patient has no known allergies.    Review of Systems  Gastrointestinal:  Positive for abdominal pain.  All other systems reviewed and are negative.   Updated Vital Signs BP (!) 141/99 (BP Location: Right Arm)   Pulse 95   Temp (!) 97.3 F (36.3 C)   Resp 17   SpO2 98%   Physical Exam Constitutional:      General: He is not in acute distress.    Appearance: He is well-developed.  HENT:     Head: Atraumatic.  Eyes:     Conjunctiva/sclera: Conjunctivae normal.  Cardiovascular:     Rate and Rhythm: Normal rate and regular rhythm.     Pulses: Normal pulses.     Heart sounds: Normal heart sounds.  Pulmonary:     Effort: Pulmonary effort is normal.     Breath sounds: Normal breath sounds.  Abdominal:     General: Bowel sounds are normal.     Palpations: Abdomen is soft.     Tenderness: There  is abdominal tenderness (Mild generalized tenderness no focal point tenderness).  Musculoskeletal:     Cervical back: Normal range of motion and neck supple.  Skin:    Findings: No rash.  Neurological:     Mental Status: He is alert.     (all labs ordered are listed, but only abnormal results are displayed) Labs Reviewed  COMPREHENSIVE METABOLIC PANEL WITH GFR - Abnormal; Notable for the following components:      Result Value   Sodium 134 (*)    Potassium 5.4 (*)    Chloride 92 (*)    CO2 14 (*)    Glucose, Bld 345 (*)    Total Protein 8.2 (*)    Alkaline Phosphatase 129 (*)    Anion gap 27 (*)    All other components within  normal limits  CBC - Abnormal; Notable for the following components:   RBC 6.21 (*)    Hemoglobin 19.1 (*)    HCT 56.5 (*)    All other components within normal limits  URINALYSIS, ROUTINE W REFLEX MICROSCOPIC - Abnormal; Notable for the following components:   Glucose, UA >=500 (*)    Ketones, ur 80 (*)    Protein, ur 100 (*)    Bacteria, UA RARE (*)    All other components within normal limits  CBG MONITORING, ED - Abnormal; Notable for the following components:   Glucose-Capillary 302 (*)    All other components within normal limits  I-STAT CHEM 8, ED - Abnormal; Notable for the following components:   Sodium 129 (*)    Potassium 8.2 (*)    Glucose, Bld 349 (*)    Calcium , Ion 1.00 (*)    TCO2 17 (*)    Hemoglobin 18.7 (*)    HCT 55.0 (*)    All other components within normal limits  I-STAT CHEM 8, ED - Abnormal; Notable for the following components:   Glucose, Bld 227 (*)    Calcium , Ion 1.14 (*)    TCO2 18 (*)    Hemoglobin 17.7 (*)    All other components within normal limits  I-STAT CG4 LACTIC ACID, ED - Abnormal; Notable for the following components:   Lactic Acid, Venous 2.0 (*)    All other components within normal limits  LIPASE, BLOOD  BETA-HYDROXYBUTYRIC ACID  BETA-HYDROXYBUTYRIC ACID  BETA-HYDROXYBUTYRIC ACID  BETA-HYDROXYBUTYRIC ACID  BASIC METABOLIC PANEL WITH GFR  BASIC METABOLIC PANEL WITH GFR  BASIC METABOLIC PANEL WITH GFR  BASIC METABOLIC PANEL WITH GFR  I-STAT VENOUS BLOOD GAS, ED    EKG: None  Radiology: CT ABDOMEN PELVIS W CONTRAST Result Date: 03/29/2024 EXAM: CT ABDOMEN AND PELVIS WITH CONTRAST 03/29/2024 04:24:37 PM TECHNIQUE: CT of the abdomen and pelvis was performed with the administration of 100 mL of iohexol  (OMNIPAQUE ) 350 MG/ML injection. Multiplanar reformatted images are provided for review. Automated exposure control, iterative reconstruction, and/or weight-based adjustment of the mA/kV was utilized to reduce the radiation dose to  as low as reasonably achievable. COMPARISON: None available. CLINICAL HISTORY: Abdominal pain, acute, nonlocalized. FINDINGS: LOWER CHEST: No acute abnormality. LIVER: The liver is unremarkable. GALLBLADDER AND BILE DUCTS: Gallbladder is unremarkable. No biliary ductal dilatation. SPLEEN: No acute abnormality. PANCREAS: No acute abnormality. ADRENAL GLANDS: No acute abnormality. KIDNEYS, URETERS AND BLADDER: Subcentimeter hypodensity in the upper pole of the right kidney, too small to definitively characterize, but likely a small cyst. Per consensus, no follow-up is needed for simple Bosniak type 1 and 2 renal cysts, unless the patient has  a malignancy history or risk factors. No stones in the kidneys or ureters. No hydronephrosis. No perinephric or periureteral stranding. Urinary bladder is unremarkable. GI AND BOWEL: Stomach demonstrates no acute abnormality. Normal gas filled appendix. There is no bowel obstruction. PERITONEUM AND RETROPERITONEUM: No ascites. No free air. No free pelvic fluid. VASCULATURE: Aorta is normal in caliber. Scattered aortoiliac atherosclerosis, more so than expected for patients given age. LYMPH NODES: No lymphadenopathy. REPRODUCTIVE ORGANS: No acute abnormality. BONES AND SOFT TISSUES: No acute osseous abnormality. No focal soft tissue abnormality. IMPRESSION: 1. No acute findings in the abdomen or pelvis. 2. Calcified and noncalcified aortoiliac atherosclerosis, more so than expected for patients given age. Appropriate risk stratification recommended. Electronically signed by: Rogelia Myers MD 03/29/2024 04:39 PM EST RP Workstation: [id number]     .Critical Care  Performed by: Nivia Colon, PA-C Authorized by: Nivia Colon, PA-C   Critical care provider statement:    Critical care time (minutes):  30   Critical care was time spent personally by me on the following activities:  Development of treatment plan with patient or surrogate, discussions with consultants, evaluation  of patient's response to treatment, examination of patient, ordering and review of laboratory studies, ordering and review of radiographic studies, ordering and performing treatments and interventions, pulse oximetry, re-evaluation of patient's condition and review of old charts    Medications Ordered in the ED  insulin  regular, human (MYXREDLIN ) 100 units/ 100 mL infusion (15 Units/hr Intravenous New Bag/Given 03/29/24 1756)  lactated ringers  infusion (has no administration in time range)  dextrose  5 % in lactated ringers  infusion (has no administration in time range)  dextrose  50 % solution 0-50 mL (has no administration in time range)  ondansetron  (ZOFRAN -ODT) disintegrating tablet 4 mg (4 mg Oral Given 03/29/24 1414)  iohexol  (OMNIPAQUE ) 350 MG/ML injection 100 mL (100 mLs Intravenous Contrast Given 03/29/24 1625)  lactated ringers  bolus 1,506 mL (0 mLs Intravenous Stopped 03/29/24 1848)                                    Medical Decision Making Amount and/or Complexity of Data Reviewed Labs: ordered. ECG/medicine tests: ordered.  Risk Prescription drug management. Decision regarding hospitalization.   BP 116/85   Pulse 94   Temp 98.4 F (36.9 C) (Oral)   Resp 16   SpO2 97%   83:68 PM  34 year old male with history of diabetes, atrial fibrillation not on any anticoagulant who presents with complaint of abdominal pain.  Patient states for the past 3 days he has had diffuse abdominal discomfort.  He also endorses significant nausea without vomiting or diarrhea.  He states he feels dehydrated.  Endorses increased thirst and increased urination.  He does not endorse any fever or chills no burning on urination and no bowel changes.  Does use tobacco.  He mention he have not received a refill of his insulin  for several weeks.  He was recently seen in the hospital several days ago for elevated blood sugar and stay for 1 day.  States he did not get discharged with insulin  and still without  it.  Patient denies alcohol use.  On exam patient is mentating appropriately in no acute discomfort.  Normal oral mucosa normal skin turgor.  Mild diffuse abdominal tenderness without focal point tenderness.  Bowel sounds present.  Vital signs overall reassuring.  -Labs ordered, independently viewed and interpreted by me.  Labs remarkable for elevated CBG  of 345 with an anion gap of 27, and 80 ketones in urine concerning for DKA.  Normal WBC -The patient was maintained on a cardiac monitor.  I personally viewed and interpreted the cardiac monitored which showed an underlying rhythm of: Sinus rhythm -Imaging independently viewed and interpreted by me and I agree with radiologist's interpretation.  Result remarkable for abdominal pelvis CT scan without acute finding patient does have significant calcified and noncalcified aortoiliac atherosclerosis. -This patient presents to the ED for concern of abdominal pain, this involves an extensive number of treatment options, and is a complaint that carries with it a high risk of complications and morbidity.  The differential diagnosis includes DKA, colitis, diverticulitis, pancreatitis, cholecystitis, UTI, GERD -Co morbidities that complicate the patient evaluation includes diabetes, A-fib -Treatment includes Endo tool for DKA -Reevaluation of the patient after these medicines showed that the patient improved -PCP office notes or outside notes reviewed -Discussion with specialist including [facility] Dr. Trixie who agrees to admit pt for DKA -Escalation to admission/observation considered: pt agrees with admission   Please note an i-STAT Chem-8 have recently resulted showing a potassium of 8.2.  I do not think this is an adequate value as his previous labs including a CMP showing a potassium of 5.4.  This is likely due to hemolysis.  Will obtain EKG, and will recheck potassium level.  Fortunately on repeat i-STAT Chem-8, potassium is within normal  limit.  The previous value is a lab error.     Final diagnoses:  Diabetic ketoacidosis without coma associated with type 2 diabetes mellitus [facility])    ED Discharge Orders     None          Nivia Colon, PA-C 03/29/24 1941  "

## 2024-03-29 NOTE — H&P (Signed)
 " History and Physical    Ricardo Boyer FMW:[id number] DOB: 10/08/1990 DOA: 03/29/2024  I have briefly reviewed the patient's prior medical records in [facility]  PCP: Pcp, No  Patient coming from: home  Chief Complaint: Nausea, abdominal pain, elevated sugars  HPI: Ricardo Boyer is a 34 y.o. male with medical history significant of DM2, PAF, tobacco use, comes into the hospital with complaints of nausea, abdominal pain, elevated sugars at home.  Patient tells me that he has ran out of his home insulin  for the past 2 to 3 weeks and has been having difficulties getting it refilled.  He denies any recent fever or chills, denies any chest pain, denies any shortness of breath.  He reports abdominal discomfort over the last couple of days, nausea, but no vomiting.  He continues to smoke, a pack will last him about 4 days  ED Course: In the emergency room he is afebrile 97.3, he is in sinus rhythm with rates in the 70s-80s, he is normotensive and satting well on room air.  Blood work reveals an initial CBG in the 300 range, potassium 5.4, bicarb 14 and anion gap of 27.  Hemoglobin was 19.1.  He was given IV fluids, placed on insulin  infusion and we are asked to admit for DKA  Review of Systems: All systems reviewed, and apart from HPI, all negative  Past Medical History:  Diagnosis Date   Diabetes mellitus without complication (HCC)    Dysrhythmia    atrial fibrilation   Gonorrhea     No past surgical history on file.   reports that he has been smoking cigarettes. He has a 1 pack-year smoking history. He has been exposed to tobacco smoke. He has never used smokeless tobacco. He reports that he does not drink alcohol and does not use drugs.  Allergies[1]  Family History  Problem Relation Age of Onset   Diabetes Mother     Prior to Admission medications  Medication Sig Start Date End Date Taking? Authorizing Provider  Continuous Glucose Sensor (FREESTYLE LIBRE 3  PLUS SENSOR) MISC 1 Device by Other route every 14 (fourteen) days. Change sensor every 15 days. Patient not taking: Reported on 03/16/2024 01/27/24   Shamleffer, Ibtehal Jaralla, MD  ibuprofen  (ADVIL ) 200 MG tablet Take 400 mg by mouth every 6 (six) hours as needed for headache.    [provider]  Insulin  Glargine (BASAGLAR  KWIKPEN) 100 UNIT/ML Inject 20 Units into the skin daily. Patient not taking: Reported on 03/16/2024 01/27/24   Shamleffer, Ibtehal Jaralla, MD  insulin  lispro (HUMALOG  KWIKPEN) 100 UNIT/ML KwikPen Inject 8 Units into the skin 3 (three) times daily. 01/27/24   Shamleffer, Ibtehal Jaralla, MD  Insulin  Pen Needle 32G X 4 MM MISC 1 Device by Does not apply route in the morning, at noon, in the evening, and at bedtime. 01/27/24   Shamleffer, Ibtehal Jaralla, MD  metFORMIN  (GLUCOPHAGE -XR) 500 MG 24 hr tablet Take 2 tablets (1,000 mg total) by mouth 2 (two) times daily with a meal. 01/27/24   Shamleffer, Donell Cardinal, MD  diltiazem  (CARDIZEM ) 30 MG tablet Take 1 tablet (30 mg total) by mouth as needed (for palpitations/ fast heart rates). 06/24/13 12/08/19  Marcine Caffie HERO, PA-C    Physical Exam: Vitals:   03/29/24 1348 03/29/24 1644  BP: (!) 141/99 116/85  Pulse: 95 94  Resp: 17 16  Temp: (!) 97.3 F (36.3 C) 98.4 F (36.9 C)  TempSrc:  Oral  SpO2: 98%  97%   Constitutional: NAD, calm, comfortable Eyes: lids and conjunctivae normal ENMT: Mucous membranes are dry.  Neck: normal, supple Respiratory: clear to auscultation bilaterally, no wheezing, no crackles.  Cardiovascular: Regular rate and rhythm, no murmurs / rubs / gallops. No extremity edema.   Abdomen: no tenderness, no masses palpated. Bowel sounds positive.  Musculoskeletal: no clubbing / cyanosis. Normal muscle tone.  Skin: no rashes, lesions, ulcers. No induration Neurologic: Nonfocal  Labs on Admission: I have personally reviewed following labs and imaging studies  CBC: Recent Labs  Lab  03/25/24 0042 03/25/24 0114 03/25/24 0137 03/29/24 1401 03/29/24 1856  WBC 6.8  --   --  5.3  --   HGB 16.5 15.6 16.3 19.1* 18.7*  HCT 47.4 46.0 48.0 56.5* 55.0*  MCV 89.1  --   --  91.0  --   PLT 186  --   --  213  --    Basic Metabolic Panel: Recent Labs  Lab 03/25/24 0042 03/25/24 0114 03/25/24 0137 03/25/24 0518 03/29/24 1401 03/29/24 1856  NA 128* 130* 132* 134* 134* 129*  K 4.7 4.1 4.1 4.3 5.4* 8.2*  CL 92*  --  97* 99 92* 102  CO2 21*  --   --  21* 14*  --   GLUCOSE 669*  --  660* 325* 345* 349*  BUN 19  --  22* 13 13 18   CREATININE 1.05  --  0.80 0.84 1.21 0.80  CALCIUM  9.2  --   --  8.8* 9.9  --    Liver Function Tests: Recent Labs  Lab 03/29/24 1401  AST 21  ALT 27  ALKPHOS 129*  BILITOT 0.5  PROT 8.2*  ALBUMIN 4.7   Coagulation Profile: No results for input(s): INR, PROTIME in the last 168 hours. BNP (last 3 results) No results for input(s): PROBNP in the last 8760 hours. CBG: Recent Labs  Lab 03/25/24 0033 03/25/24 0326 03/29/24 1848  GLUCAP >600* 480* 302*   Thyroid Function Tests: No results for input(s): TSH, T4TOTAL, FREET4, T3FREE, THYROIDAB in the last 72 hours. Urine analysis:    Component Value Date/Time   COLORURINE YELLOW 03/29/2024 1623   APPEARANCEUR CLEAR 03/29/2024 1623   LABSPEC 1.026 03/29/2024 1623   PHURINE 5.0 03/29/2024 1623   GLUCOSEU >=500 (A) 03/29/2024 1623   HGBUR NEGATIVE 03/29/2024 1623   BILIRUBINUR NEGATIVE 03/29/2024 1623   BILIRUBINUR negative 01/06/2024 1149   KETONESUR 80 (A) 03/29/2024 1623   PROTEINUR 100 (A) 03/29/2024 1623   UROBILINOGEN 1.0 01/06/2024 1149   NITRITE NEGATIVE 03/29/2024 1623   LEUKOCYTESUR NEGATIVE 03/29/2024 1623     Radiological Exams on Admission: CT ABDOMEN PELVIS W CONTRAST Result Date: 03/29/2024 EXAM: CT ABDOMEN AND PELVIS WITH CONTRAST 03/29/2024 04:24:37 PM TECHNIQUE: CT of the abdomen and pelvis was performed with the administration of 100 mL of  iohexol  (OMNIPAQUE ) 350 MG/ML injection. Multiplanar reformatted images are provided for review. Automated exposure control, iterative reconstruction, and/or weight-based adjustment of the mA/kV was utilized to reduce the radiation dose to as low as reasonably achievable. COMPARISON: None available. CLINICAL HISTORY: Abdominal pain, acute, nonlocalized. FINDINGS: LOWER CHEST: No acute abnormality. LIVER: The liver is unremarkable. GALLBLADDER AND BILE DUCTS: Gallbladder is unremarkable. No biliary ductal dilatation. SPLEEN: No acute abnormality. PANCREAS: No acute abnormality. ADRENAL GLANDS: No acute abnormality. KIDNEYS, URETERS AND BLADDER: Subcentimeter hypodensity in the upper pole of the right kidney, too small to definitively characterize, but likely a small cyst. Per consensus, no follow-up is needed for simple  Bosniak type 1 and 2 renal cysts, unless the patient has a malignancy history or risk factors. No stones in the kidneys or ureters. No hydronephrosis. No perinephric or periureteral stranding. Urinary bladder is unremarkable. GI AND BOWEL: Stomach demonstrates no acute abnormality. Normal gas filled appendix. There is no bowel obstruction. PERITONEUM AND RETROPERITONEUM: No ascites. No free air. No free pelvic fluid. VASCULATURE: Aorta is normal in caliber. Scattered aortoiliac atherosclerosis, more so than expected for patients given age. LYMPH NODES: No lymphadenopathy. REPRODUCTIVE ORGANS: No acute abnormality. BONES AND SOFT TISSUES: No acute osseous abnormality. No focal soft tissue abnormality. IMPRESSION: 1. No acute findings in the abdomen or pelvis. 2. Calcified and noncalcified aortoiliac atherosclerosis, more so than expected for patients given age. Appropriate risk stratification recommended. Electronically signed by: Rogelia Myers MD 03/29/2024 04:39 PM EST RP Workstation: [id number]    EKG: Independently reviewed.  Sinus rhythm on telemetry monitor  Assessment/Plan Principal  problem DKA, DM 2, poorly controlled with hyperglycemia -patient will be admitted to progressive, placed on insulin  infusion, fluids, continue to monitor beta-hydroxybutyrate as well as BMP - N.p.o. for now - Will need refill of his insulin  upon discharge preferably from [facility] pharmacy so that he gets it before he leaves  Active problems Hyperkalemia -initial potassium 5.4, repeat 8.2 initially, but this was an error and stat potassium was 4.7.  Hyperkalemia is in the setting of acidosis.  Continue to monitor with Q4 labs  Pseudohyponatremia-in the setting of hyperglycemia  Erythrocytosis-likely in the setting of dehydration.  Has been placed on fluids  Tobacco use-placed on nicotine  patch  DVT prophylaxis: Lovenox   Code Status: Full code  Family Communication: no family at bedside  Bed Type: Progressive Consults called: none  Obs/Inp: Obs  Nilda Fendt, MD, PhD [facility]  Contact via [email or web address]  03/29/2024, 7:07 PM         [1] No Known Allergies  "

## 2024-03-30 ENCOUNTER — Ambulatory Visit: Admitting: Family Medicine

## 2024-03-30 ENCOUNTER — Other Ambulatory Visit (HOSPITAL_COMMUNITY): Payer: Self-pay

## 2024-03-30 ENCOUNTER — Other Ambulatory Visit (HOSPITAL_BASED_OUTPATIENT_CLINIC_OR_DEPARTMENT_OTHER): Payer: Self-pay

## 2024-03-30 LAB — BETA-HYDROXYBUTYRIC ACID: Beta-Hydroxybutyric Acid: 1.1 mmol/L — ABNORMAL HIGH (ref 0.05–0.27)

## 2024-03-30 LAB — BASIC METABOLIC PANEL WITH GFR
Anion gap: 14 (ref 5–15)
BUN: 9 mg/dL (ref 6–20)
CO2: 21 mmol/L — ABNORMAL LOW (ref 22–32)
Calcium: 8.9 mg/dL (ref 8.9–10.3)
Chloride: 99 mmol/L (ref 98–111)
Creatinine, Ser: 0.82 mg/dL (ref 0.61–1.24)
GFR, Estimated: >60 mL/min
Glucose, Bld: 208 mg/dL — ABNORMAL HIGH (ref 70–99)
Potassium: 3.5 mmol/L (ref 3.5–5.1)
Sodium: 134 mmol/L — ABNORMAL LOW (ref 135–145)

## 2024-03-30 LAB — CBG MONITORING, ED
Glucose-Capillary: 109 mg/dL — ABNORMAL HIGH (ref 70–99)
Glucose-Capillary: 153 mg/dL — ABNORMAL HIGH (ref 70–99)
Glucose-Capillary: 190 mg/dL — ABNORMAL HIGH (ref 70–99)
Glucose-Capillary: 199 mg/dL — ABNORMAL HIGH (ref 70–99)
Glucose-Capillary: 209 mg/dL — ABNORMAL HIGH (ref 70–99)
Glucose-Capillary: 320 mg/dL — ABNORMAL HIGH (ref 70–99)
Glucose-Capillary: 406 mg/dL — ABNORMAL HIGH (ref 70–99)

## 2024-03-30 MED ORDER — INSULIN ASPART 100 UNIT/ML IJ SOLN
0.0000 [IU] | Freq: Three times a day (TID) | INTRAMUSCULAR | Status: DC
  Administered 2024-03-30: 9 [IU] via SUBCUTANEOUS
  Filled 2024-03-30: qty 9

## 2024-03-30 MED ORDER — INSULIN LISPRO (1 UNIT DIAL) 100 UNIT/ML (KWIKPEN): 15.0000 [IU] | PEN_INJECTOR | Freq: Two times a day (BID) | SUBCUTANEOUS | 0 refills | Status: DC

## 2024-03-30 MED ORDER — INSULIN GLARGINE-YFGN 100 UNIT/ML ~~LOC~~ SOLN
15.0000 [IU] | Freq: Every day | SUBCUTANEOUS | Status: DC
  Administered 2024-03-30: 15 [IU] via SUBCUTANEOUS
  Filled 2024-03-30: qty 0.15

## 2024-03-30 MED ORDER — BASAGLAR KWIKPEN 100 UNIT/ML ~~LOC~~ SOPN
15.0000 [IU] | PEN_INJECTOR | Freq: Every day | SUBCUTANEOUS | 0 refills | Status: DC
  Filled 2024-03-30: qty 6, 30d supply, fill #0

## 2024-03-30 MED ORDER — INSULIN PEN NEEDLE 32G X 4 MM MISC
1.0000 | Freq: Four times a day (QID) | 3 refills | Status: AC
  Filled 2024-03-30: qty 400, 90d supply, fill #0

## 2024-03-30 MED ORDER — BASAGLAR KWIKPEN 100 UNIT/ML ~~LOC~~ SOPN
15.0000 [IU] | PEN_INJECTOR | Freq: Every day | SUBCUTANEOUS | 0 refills | Status: AC
  Filled 2024-03-30: qty 15, 90d supply, fill #0

## 2024-03-30 MED ORDER — FREESTYLE LIBRE 3 PLUS SENSOR MISC
3 refills | Status: AC
  Filled 2024-03-30: qty 6, 90d supply, fill #0

## 2024-03-30 MED ORDER — INSULIN ASPART 100 UNIT/ML IJ SOLN: 15.0000 [IU] | Freq: Three times a day (TID) | INTRAMUSCULAR | Status: DC

## 2024-03-30 MED ORDER — INSULIN LISPRO (1 UNIT DIAL) 100 UNIT/ML (KWIKPEN)
15.0000 [IU] | PEN_INJECTOR | Freq: Two times a day (BID) | SUBCUTANEOUS | 0 refills | Status: DC
  Filled 2024-03-30: qty 21, 70d supply, fill #0

## 2024-03-30 MED ORDER — INSULIN ASPART 100 UNIT/ML IJ SOLN
10.0000 [IU] | Freq: Three times a day (TID) | INTRAMUSCULAR | Status: DC
  Administered 2024-03-30: 10 [IU] via SUBCUTANEOUS
  Filled 2024-03-30: qty 10

## 2024-03-30 MED ORDER — INSULIN LISPRO (1 UNIT DIAL) 100 UNIT/ML (KWIKPEN)
15.0000 [IU] | PEN_INJECTOR | Freq: Two times a day (BID) | SUBCUTANEOUS | 0 refills | Status: AC
  Filled 2024-03-30: qty 21, 70d supply, fill #0

## 2024-03-30 MED ORDER — INSULIN ASPART 100 UNIT/ML IJ SOLN: 6.0000 [IU] | Freq: Three times a day (TID) | INTRAMUSCULAR | Status: DC

## 2024-03-30 MED ORDER — LACTATED RINGERS IV SOLN: INTRAVENOUS | Status: DC

## 2024-03-30 MED ORDER — BASAGLAR KWIKPEN 100 UNIT/ML ~~LOC~~ SOPN: 15.0000 [IU] | PEN_INJECTOR | Freq: Every day | SUBCUTANEOUS | 0 refills | Status: DC

## 2024-03-30 NOTE — Discharge Summary (Signed)
 "     Physician Discharge Summary  Ricardo Boyer FMW:[id number] DOB: [birth year] DOA: 03/29/2024  PCP: Pcp, No  Admit date: 03/29/2024 Discharge date: 03/30/2024  Admitted From:  Discharge disposition: Home   Recommendations for Outpatient Follow-Up:   Patient must get his insulin  via mail order Bring log of blood sugars to PCP/endocrinologist   Discharge Diagnosis:   Principal Problem:   DKA (diabetic ketoacidosis) (HCC)    Discharge Condition: Improved.  Diet recommendation:  Carbohydrate-modified.    Wound care: None.  Code status: Full.   History of Present Illness:   Ricardo Boyer is a 34 y.o. male with medical history significant of DM2, PAF, tobacco use, comes into the hospital with complaints of nausea, abdominal pain, elevated sugars at home.  Patient tells me that he has ran out of his home insulin  for the past 2 to 3 weeks and has been having difficulties getting it refilled.  He denies any recent fever or chills, denies any chest pain, denies any shortness of breath.  He reports abdominal discomfort over the last couple of days, nausea, but no vomiting.  He continues to smoke, a pack will last him about 4 days   ED Course: In the emergency room he is afebrile 97.3, he is in sinus rhythm with rates in the 70s-80s, he is normotensive and satting well on room air.  Blood work reveals an initial CBG in the 300 range, potassium 5.4, bicarb 14 and anion gap of 27.  Hemoglobin was 19.1.  He was given IV fluids, placed on insulin  infusion and we are asked to admit for DKA   Hospital Course by Problem:   DKA, DM 2, poorly controlled with hyperglycemia  -Resolved - Patient has been unable to get his medications for various reasons for the last weeks to months.  I called [facility] and they do not contract with his insurance.  He must get them mail order.  Did discuss with [facility] pharmacy and [name] is able to do 3 months mail order that patient can pick  up today 1/22.  Patient knows that going forward that he must get his medications through the mail order. -Resume prior medications and adjust once he has reliably been on these    Hyperkalemia -resolved Pseudohyponatremia-in the setting of hyperglycemia   Erythrocytosis-likely in the setting of dehydration.  Has been placed on fluids   Tobacco use-placed on nicotine  patch       Discharge Exam:   Vitals:   03/30/24 0900 03/30/24 0908  BP: 106/67   Pulse: 87   Resp: (!) 21   Temp:  98.3 F (36.8 C)  SpO2: 96%    Vitals:   03/30/24 0530 03/30/24 0600 03/30/24 0900 03/30/24 0908  BP:  113/73 106/67   Pulse:  71 87   Resp:  20 (!) 21   Temp: 97.9 F (36.6 C)   98.3 F (36.8 C)  TempSrc: Oral   Oral  SpO2:  96% 96%     General exam: Appears calm and comfortable.   The results of significant diagnostics from this hospitalization (including imaging, microbiology, ancillary and laboratory) are listed below for reference.     Procedures and Diagnostic Studies:   CT ABDOMEN PELVIS W CONTRAST Result Date: 03/29/2024 EXAM: CT ABDOMEN AND PELVIS WITH CONTRAST 03/29/2024 04:24:37 PM TECHNIQUE: CT of the abdomen and pelvis was performed with the administration of 100 mL of iohexol  (OMNIPAQUE ) 350 MG/ML injection. Multiplanar reformatted images are provided for  review. Automated exposure control, iterative reconstruction, and/or weight-based adjustment of the mA/kV was utilized to reduce the radiation dose to as low as reasonably achievable. COMPARISON: None available. CLINICAL HISTORY: Abdominal pain, acute, nonlocalized. FINDINGS: LOWER CHEST: No acute abnormality. LIVER: The liver is unremarkable. GALLBLADDER AND BILE DUCTS: Gallbladder is unremarkable. No biliary ductal dilatation. SPLEEN: No acute abnormality. PANCREAS: No acute abnormality. ADRENAL GLANDS: No acute abnormality. KIDNEYS, URETERS AND BLADDER: Subcentimeter hypodensity in the upper pole of the right kidney, too  small to definitively characterize, but likely a small cyst. Per consensus, no follow-up is needed for simple Bosniak type 1 and 2 renal cysts, unless the patient has a malignancy history or risk factors. No stones in the kidneys or ureters. No hydronephrosis. No perinephric or periureteral stranding. Urinary bladder is unremarkable. GI AND BOWEL: Stomach demonstrates no acute abnormality. Normal gas filled appendix. There is no bowel obstruction. PERITONEUM AND RETROPERITONEUM: No ascites. No free air. No free pelvic fluid. VASCULATURE: Aorta is normal in caliber. Scattered aortoiliac atherosclerosis, more so than expected for patients given age. LYMPH NODES: No lymphadenopathy. REPRODUCTIVE ORGANS: No acute abnormality. BONES AND SOFT TISSUES: No acute osseous abnormality. No focal soft tissue abnormality. IMPRESSION: 1. No acute findings in the abdomen or pelvis. 2. Calcified and noncalcified aortoiliac atherosclerosis, more so than expected for patients given age. Appropriate risk stratification recommended. Electronically signed by: Rogelia Myers MD 03/29/2024 04:39 PM EST RP Workstation: [id number]     Labs:   Basic Metabolic Panel: Recent Labs  Lab 03/25/24 0042 03/25/24 0114 03/25/24 0518 03/29/24 1401 03/29/24 1856 03/29/24 1927 03/30/24 0114  NA 128*   < > 134* 134* 129* 135 134*  K 4.7   < > 4.3 5.4* 8.2* 4.7 3.5  CL 92*   < > 99 92* 102 101 99  CO2 21*  --  21* 14*  --   --  21*  GLUCOSE 669*   < > 325* 345* 349* 227* 208*  BUN 19   < > 13 13 18 14 9   CREATININE 1.05   < > 0.84 1.21 0.80 0.70 0.82  CALCIUM  9.2  --  8.8* 9.9  --   --  8.9   < > = values in this interval not displayed.   GFR Estimated Creatinine Clearance: 126.7 mL/min (by C-G formula based on SCr of 0.82 mg/dL). Liver Function Tests: Recent Labs  Lab 03/29/24 1401  AST 21  ALT 27  ALKPHOS 129*  BILITOT 0.5  PROT 8.2*  ALBUMIN 4.7   Recent Labs  Lab 03/29/24 1401  LIPASE 24   No results for  input(s): AMMONIA in the last 168 hours. Coagulation profile No results for input(s): INR, PROTIME in the last 168 hours.  CBC: Recent Labs  Lab 03/25/24 0042 03/25/24 0114 03/25/24 0137 03/29/24 1401 03/29/24 1856 03/29/24 1927  WBC 6.8  --   --  5.3  --   --   HGB 16.5 15.6 16.3 19.1* 18.7* 17.7*  HCT 47.4 46.0 48.0 56.5* 55.0* 52.0  MCV 89.1  --   --  91.0  --   --   PLT 186  --   --  213  --   --    Cardiac Enzymes: No results for input(s): CKTOTAL, CKMB, CKMBINDEX, TROPONINI in the last 168 hours. BNP: Invalid input(s): POCBNP CBG: Recent Labs  Lab 03/30/24 0200 03/30/24 0258 03/30/24 0414 03/30/24 0528 03/30/24 0842  GLUCAP 153* 109* 190* 209* 406*   D-Dimer No results for input(s):  DDIMER in the last 72 hours. Hgb A1c No results for input(s): HGBA1C in the last 72 hours. Lipid Profile No results for input(s): CHOL, HDL, LDLCALC, TRIG, CHOLHDL, LDLDIRECT in the last 72 hours. Thyroid function studies No results for input(s): TSH, T4TOTAL, T3FREE, THYROIDAB in the last 72 hours.  Invalid input(s): FREET3 Anemia work up No results for input(s): VITAMINB12, FOLATE, FERRITIN, TIBC, IRON, RETICCTPCT in the last 72 hours. Microbiology No results found for this or any previous visit (from the past 240 hours).   Discharge Instructions:   Discharge Instructions     Diet Carb Modified   Complete by: As directed    Discharge instructions   Complete by: As directed    Pick up your medications at the [facility] free standing pharmacy.  In the future, you will need to get them via mail order.   Increase activity slowly   Complete by: As directed       Allergies as of 03/30/2024   No Known Allergies      Medication List     TAKE these medications    Basaglar  KwikPen 100 UNIT/ML Inject 15 Units into the skin daily.   bismuth subsalicylate 262 MG chewable tablet Commonly known as: PEPTO BISMOL Chew 524 mg  by mouth as needed for diarrhea or loose stools or indigestion.   FreeStyle Libre 3 Plus Sensor Misc 1 Device by Other route every 14 (fourteen) days. Change sensor every 15 days.   ibuprofen  200 MG tablet Commonly known as: ADVIL  Take 400 mg by mouth every 6 (six) hours as needed for headache.   insulin  lispro 100 UNIT/ML KwikPen Commonly known as: HumaLOG  KwikPen Inject 15 Units into the skin in the morning and at bedtime.   Insulin  Pen Needle 32G X 4 MM Misc 1 Device by Does not apply route in the morning, at noon, in the evening, and at bedtime.   metFORMIN  500 MG 24 hr tablet Commonly known as: GLUCOPHAGE -XR Take 2 tablets (1,000 mg total) by mouth 2 (two) times daily with a meal.          Time coordinating discharge: 45 min  Signed:  Harlene RAYMOND Bowl DO  [facility] 03/30/2024, 10:53 AM      "

## 2024-03-30 NOTE — Inpatient Diabetes Management (Signed)
 Inpatient Diabetes Program Recommendations  AACE/ADA: New Consensus Statement on Inpatient Glycemic Control   Target Ranges:  Prepandial:   less than 140 mg/dL      Peak postprandial:   less than 180 mg/dL (1-2 hours)      Critically ill patients:  140 - 180 mg/dL   Lab Results  Component Value Date   GLUCAP 406 (H) 03/30/2024   HGBA1C >15 01/27/2024    Latest Reference Range & Units 03/30/24 02:00 03/30/24 02:58 03/30/24 04:14 03/30/24 05:28 03/30/24 08:42  Glucose-Capillary 70 - 99 mg/dL 846 (H) 890 (H) 809 (H) 209 (H) 406 (H)   Review of Glycemic Control  Diabetes history: DM2  Outpatient Diabetes medications:  FreeStyle Libre 3 CGM (Not taking)  Glargine 15 units daily  Humalog  15 units in the morning and 15 units in the evening   Has not taken insulin  in the past 2 weeks  Current orders for Inpatient glycemic control:  Semglee  15 units daily  Novolog  10 units TID with meals  Novolog  0-9 units TID   Inpatient Diabetes Program Recommendations:   Spoke with patient at bedside. Patient states he ran out of insulin  about 2 weeks ago. He gets his insulin  from [employer]. Patient states cost is not an issues and he forgot to call to request a refill. He has also worn a Free Style Libre CGM in the past but has not had it refilled recently and ran out of supplies. Patient is followed by Dr Sam for diabetes management   Patient reports checking glucose once a day and that it is usually in the 400's mg/dl. Inquired about prior A1C and patient reports not being able to recall last A1C value. Discussed A1C results >15% and explained that current A1C indicates an average glucose of 400 mg/dl over the past 2-3 months.  Discussed glucose and A1C goals. Discussed importance of checking CBGs and maintaining good CBG control to prevent long-term and short-term complications. Explained how hyperglycemia leads to damage within blood vessels which lead to the common complications seen  with uncontrolled diabetes. Stressed to the patient the importance of improving glycemic control to prevent further complications from uncontrolled diabetes. Discussed impact of nutrition, exercise, stress, sickness, and medications on diabetes control.  Discussed carbohydrates, carbohydrate goals per day and meal, along with portion sizes. Encouraged patient to check glucose 4 times per day (before meals and at bedtime). Provided patient with FreeStyle Libre CGM sample and assisted him with staring CGM. Placed sensor on left upper arm. Encouraged patient to call his pharmacy in the future when his insulin  supply is low to prevent from running our of insulin  in the future.  Patient verbalized understanding of information discussed and reports no further questions at this time related to diabetes.   Per Pharmacy - If an RX can be sent to our [facility], they can run the claim and see for sure if it will go through. If they process for 90 days, it *should* go thru and *should* be able to be picked up today instead of having it mailed out.   Thanks,  Lavanda Search, RN, MSN, [facility]  Inpatient Diabetes Coordinator  Pager [phone] (8a-5p)

## 2024-03-30 NOTE — Progress Notes (Addendum)
 Suspect patient can go home later today once issues with insurance have been worked out.  I have sent his home regimen to [facility] pharmacy to see how much/if there is an issue getting.  Harlene Bowl DO  TOC unable to fill as requires mail in 90 day supply and must go through [employer].  Called [facility] x 2-- placed on hold for 45 min and eventually hung up on. Called back and was able to reach pharmacy-- they are not contracted with his insurance  Will ask DM coordinator if patient needs to go on [employer] brand insulin  until his mail order arrives JV

## 2024-03-30 NOTE — Discharge Instructions (Signed)
 pick these up at [facility] Basaglar  KwikPen  FreeStyle Libre 3 Plus Sensor  insulin  lispro  Insulin  Pen Needle Address: [address], [place] [address]  Hours: Mon-Fri 7:30a-7p; Sat 8a-4:30p; [place]  Phone: [phone]

## 2024-03-30 NOTE — ED Notes (Signed)
 HOSPTIAL BED ORDERED

## 2024-04-17 ENCOUNTER — Encounter: Payer: Self-pay | Admitting: Dietician

## 2024-05-09 ENCOUNTER — Ambulatory Visit: Admitting: Family Medicine
